# Patient Record
Sex: Female | Born: 1995 | Race: Black or African American | Hispanic: No | Marital: Single | Smoking: Never smoker
Health system: Southern US, Community
[De-identification: ages and names within clinical notes are randomized; demographics above are authoritative.]

## PROBLEM LIST (undated history)

## (undated) ENCOUNTER — Inpatient Hospital Stay (HOSPITAL_COMMUNITY): Payer: Self-pay

## (undated) DIAGNOSIS — N83209 Unspecified ovarian cyst, unspecified side: Secondary | ICD-10-CM

## (undated) HISTORY — PX: OVARIAN CYST SURGERY: SHX726

---

## 2015-07-24 ENCOUNTER — Emergency Department: Payer: Commercial Managed Care - HMO

## 2015-07-24 ENCOUNTER — Encounter: Payer: Self-pay | Admitting: Emergency Medicine

## 2015-07-24 ENCOUNTER — Emergency Department
Admission: EM | Admit: 2015-07-24 | Discharge: 2015-07-24 | Disposition: A | Discharge status: Home / Self Care | Payer: Commercial Managed Care - HMO | Attending: Emergency Medicine | Admitting: Emergency Medicine

## 2015-07-24 DIAGNOSIS — R102 Pelvic and perineal pain: Secondary | ICD-10-CM

## 2015-07-24 DIAGNOSIS — Z3202 Encounter for pregnancy test, result negative: Secondary | ICD-10-CM | POA: Insufficient documentation

## 2015-07-24 DIAGNOSIS — N83209 Unspecified ovarian cyst, unspecified side: Secondary | ICD-10-CM

## 2015-07-24 DIAGNOSIS — N8320 Unspecified ovarian cysts: Secondary | ICD-10-CM | POA: Diagnosis not present

## 2015-07-24 HISTORY — DX: Unspecified ovarian cyst, unspecified side: N83.209

## 2015-07-24 LAB — URINALYSIS COMPLETE WITH MICROSCOPIC (ARMC ONLY)
BILIRUBIN URINE: NEGATIVE
GLUCOSE, UA: NEGATIVE mg/dL
HGB URINE DIPSTICK: NEGATIVE
Ketones, ur: NEGATIVE mg/dL
LEUKOCYTES UA: NEGATIVE
NITRITE: NEGATIVE
PH: 6 (ref 5.0–8.0)
PROTEIN: NEGATIVE mg/dL
RBC / HPF: NONE SEEN RBC/hpf (ref 0–5)
SPECIFIC GRAVITY, URINE: 1.018 (ref 1.005–1.030)

## 2015-07-24 LAB — CBC WITH DIFFERENTIAL/PLATELET
Basophils Absolute: 0 10*3/uL (ref 0–0.1)
Basophils Relative: 1 %
Eosinophils Absolute: 0.4 10*3/uL (ref 0–0.7)
Eosinophils Relative: 6 %
HCT: 39.4 % (ref 35.0–47.0)
Hemoglobin: 13 g/dL (ref 12.0–16.0)
Lymphocytes Relative: 44 %
Lymphs Abs: 2.5 10*3/uL (ref 1.0–3.6)
MCH: 31.1 pg (ref 26.0–34.0)
MCHC: 33 g/dL (ref 32.0–36.0)
MCV: 94.1 fL (ref 80.0–100.0)
MONO ABS: 0.6 10*3/uL (ref 0.2–0.9)
MONOS PCT: 11 %
NEUTROS PCT: 38 %
Neutro Abs: 2.1 10*3/uL (ref 1.4–6.5)
PLATELETS: 190 10*3/uL (ref 150–440)
RBC: 4.19 MIL/uL (ref 3.80–5.20)
RDW: 12.7 % (ref 11.5–14.5)
WBC: 5.6 10*3/uL (ref 3.6–11.0)

## 2015-07-24 LAB — COMPREHENSIVE METABOLIC PANEL
ALBUMIN: 4 g/dL (ref 3.5–5.0)
ALT: 10 U/L — ABNORMAL LOW (ref 14–54)
AST: 17 U/L (ref 15–41)
Alkaline Phosphatase: 61 U/L (ref 38–126)
Anion gap: 5 (ref 5–15)
BILIRUBIN TOTAL: 0.2 mg/dL — AB (ref 0.3–1.2)
BUN: 14 mg/dL (ref 6–20)
CO2: 27 mmol/L (ref 22–32)
CREATININE: 0.79 mg/dL (ref 0.44–1.00)
Calcium: 9.1 mg/dL (ref 8.9–10.3)
Chloride: 106 mmol/L (ref 101–111)
GFR calc Af Amer: >60 mL/min (ref >60)
GFR calc non Af Amer: >60 mL/min (ref >60)
Glucose, Bld: 91 mg/dL (ref 65–99)
POTASSIUM: 3.5 mmol/L (ref 3.5–5.1)
Sodium: 138 mmol/L (ref 135–145)
Total Protein: 7.5 g/dL (ref 6.5–8.1)

## 2015-07-24 LAB — POCT PREGNANCY, URINE: Preg Test, Ur: NEGATIVE

## 2015-07-24 MED ORDER — MORPHINE SULFATE 2 MG/ML IJ SOLN
2.0000 mg | Freq: Once | INTRAMUSCULAR | Status: AC
  Administered 2015-07-24: 2 mg via INTRAVENOUS
  Filled 2015-07-24: qty 1

## 2015-07-24 MED ORDER — OXYCODONE-ACETAMINOPHEN 5-325 MG PO TABS
1.0000 | ORAL_TABLET | Freq: Four times a day (QID) | ORAL | Status: DC | PRN
Start: 2015-07-24 — End: 2015-08-27

## 2015-07-24 MED ORDER — NAPROXEN 250 MG PO TABS: 250.0000 mg | ORAL_TABLET | Freq: Two times a day (BID) | ORAL | Status: DC

## 2015-07-24 MED ORDER — ONDANSETRON HCL 4 MG/2ML IJ SOLN
4.0000 mg | Freq: Once | INTRAMUSCULAR | Status: AC
  Administered 2015-07-24: 4 mg via INTRAVENOUS
  Filled 2015-07-24: qty 2

## 2015-07-24 NOTE — ED Notes (Signed)
Patient transported to Ultrasound 

## 2015-07-24 NOTE — ED Notes (Signed)
Pt discharged home after verbalizing understanding of discharge instructions; nad noted. 

## 2015-07-24 NOTE — ED Provider Notes (Signed)
[facility] Emergency Department Provider Note  ____________________________________________  Time seen: 6:10 AM  I have reviewed the triage vital signs and the nursing notes.   HISTORY  Chief Complaint Abdominal Pain; Pelvic Pain; and Nausea      HPI Maria Sullivan is a 19 y.o. female resents with acute onset of 10 out of 10 pelvic pain 2 hours. Patient states that she was recently diagnosed with a ovarian cyst approximately 2 weeks ago.      Past Medical History  Diagnosis Date  . Ovarian cyst     There are no active problems to display for this patient.   Past Surgical History  Procedure Laterality Date  . Ovarian cyst surgery      No current outpatient prescriptions on file.  Allergies Review of patient's allergies indicates no known allergies.  No family history on file.  Social History History  Substance Use Topics  . Smoking status: Never Smoker   . Smokeless tobacco: Never Used  . Alcohol Use: No    Review of Systems  Constitutional: Negative for fever. Eyes: Negative for visual changes. ENT: Negative for sore throat. Cardiovascular: Negative for chest pain. Respiratory: Negative for shortness of breath. Gastrointestinal: Negative for abdominal pain, vomiting and diarrhea. Genitourinary: Negative for dysuria. Positive for pelvic pain Musculoskeletal: Negative for back pain. Skin: Negative for rash. Neurological: Negative for headaches, focal weakness or numbness.   10-point ROS otherwise negative.  ____________________________________________   PHYSICAL EXAM:  VITAL SIGNS: ED Triage Vitals  Enc Vitals Group     BP 07/24/15 0550 117/74 mmHg     Pulse Rate 07/24/15 0550 106     Resp 07/24/15 0550 18     Temp 07/24/15 0550 97.9 F (36.6 C)     Temp Source 07/24/15 0550 Oral     SpO2 07/24/15 0550 99 %     Weight 07/24/15 0550 160 lb (72.576 kg)     Height 07/24/15 0550 6' (1.829 m)     Head Cir --     Peak Flow --      Pain Score 07/24/15 0554 7     Pain Loc --      Pain Edu? --      Excl. in GC? --      Constitutional: Alert and oriented. Well appearing and in no distress. Eyes: Conjunctivae are normal. PERRL. Normal extraocular movements. ENT   Head: Normocephalic and atraumatic.   Nose: No congestion/rhinnorhea.   Mouth/Throat: Mucous membranes are moist.   Neck: No stridor. Cardiovascular: Normal rate, regular rhythm. Normal and symmetric distal pulses are present in all extremities. No murmurs, rubs, or gallops. Respiratory: Normal respiratory effort without tachypnea nor retractions. Breath sounds are clear and equal bilaterally. No wheezes/rales/rhonchi. Gastrointestinal: Soft and nontender. No distention. There is no CVA tenderness.Bilateral pelvic pain with palpation Genitourinary: deferred Musculoskeletal: Nontender with normal range of motion in all extremities. No joint effusions.  No lower extremity tenderness nor edema. Neurologic:  Normal speech and language. No gross focal neurologic deficits are appreciated. Speech is normal.  Skin:  Skin is warm, dry and intact. No rash noted. Psychiatric: Mood and affect are normal. Speech and behavior are normal. Patient exhibits appropriate insight and judgment.  ____________________________________________    LABS (pertinent positives/negatives)  Labs Reviewed  COMPREHENSIVE METABOLIC PANEL - Abnormal; Notable for the following:    ALT 10 (*)    Total Bilirubin 0.2 (*)    All other components within normal limits  URINALYSIS COMPLETEWITH MICROSCOPIC (  [facility] ONLY) - Abnormal; Notable for the following:    Color, Urine YELLOW (*)    APPearance CLEAR (*)    Bacteria, UA RARE (*)    Squamous Epithelial / LPF 0-5 (*)    All other components within normal limits  CBC WITH DIFFERENTIAL/PLATELET  POCT PREGNANCY, URINE    _______  RADIOLOGY IMPRESSION: 1. Likely hemorrhagic 2.6 cm cyst within the right  ovary. 2. No evidence of ovarian torsion. 3. Normal-appearing left ovary containing numerous follicular cyst. 4. Normal-appearing uterus and endometrium. Followup ultrasound in 6-12 weeks is recommended to insure involution/resolution of the presumed hemorrhagic right ovarian cyst. If there is not involution on the follow-up examination, further evaluation with MRI might be indicated at that time.  This recommendation follows the consensus statement: Management of Asymptomatic Ovarian and Other Adnexal Cysts Imaged at [place]: Society of Radiologists in Ultrasound Consensus Conference Statement. Radiology 2010; [phone].   Electronically Signed By: Hulan Saas M.D. On: 07/24/2015 10:27     INITIAL IMPRESSION / ASSESSMENT AND PLAN / ED COURSE  Pertinent labs & imaging results that were available during my care of the patient were reviewed by me and considered in my medical decision making (see chart for details).    ____________________________________________   FINAL CLINICAL IMPRESSION(S) / ED DIAGNOSES  Final diagnoses:  Pelvic pain in female  Hemorrhagic ovarian cyst      Darci Current, MD 07/25/15 [phone]

## 2015-07-24 NOTE — ED Notes (Signed)
Pt resting in bed with eyes closed, boyfriend at bedside

## 2015-07-24 NOTE — Discharge Instructions (Signed)
Your ultrasound shows a hemorrhagic ovarian cyst on the right ovary. This should resolve on its own although you may have ongoing pain for the next week. Take naproxen twice a day and take Percocet as needed for additional pain, and follow-up with gynecology in 1-2 weeks.  You were prescribed a medication that is potentially sedating. Do not drink alcohol, drive or participate in any other potentially dangerous activities while taking this medication as it may make you sleepy. Do not take this medication with any other sedating medications, either prescription or over-the-counter. If you were prescribed Percocet or Vicodin, do not take these with acetaminophen (Tylenol) as it is already contained within these medications.   Opioid pain medications (or "narcotics") can be habit forming.  Use it as little as possible to achieve adequate pain control.  Do not use or use it with extreme caution if you have a history of opiate abuse or dependence.  If you are on a pain contract with your primary care doctor or a pain specialist, be sure to let them know you were prescribed this medication today from the [facility] Emergency Department.  This medication is intended for your use only - do not give any to anyone else and keep it in a secure place where nobody else, especially children and pets, have access to it.  It will also cause or worsen constipation, so you may want to consider taking an over-the-counter stool softener while you are taking this medication.   Abdominal Pain, Women Abdominal (stomach, pelvic, or belly) pain can be caused by many things. It is important to tell your doctor:  The location of the pain.  Does it come and go or is it present all the time?  Are there things that start the pain (eating certain foods, exercise)?  Are there other symptoms associated with the pain (fever, nausea, vomiting, diarrhea)? All of this is helpful to know when trying to find the cause of the  pain. CAUSES   Stomach: virus or bacteria infection, or ulcer.  Intestine: appendicitis (inflamed appendix), regional ileitis (Crohn's disease), ulcerative colitis (inflamed colon), irritable bowel syndrome, diverticulitis (inflamed diverticulum of the colon), or cancer of the stomach or intestine.  Gallbladder disease or stones in the gallbladder.  Kidney disease, kidney stones, or infection.  Pancreas infection or cancer.  Fibromyalgia (pain disorder).  Diseases of the female organs:  Uterus: fibroid (non-cancerous) tumors or infection.  Fallopian tubes: infection or tubal pregnancy.  Ovary: cysts or tumors.  Pelvic adhesions (scar tissue).  Endometriosis (uterus lining tissue growing in the pelvis and on the pelvic organs).  Pelvic congestion syndrome (female organs filling up with blood just before the menstrual period).  Pain with the menstrual period.  Pain with ovulation (producing an egg).  Pain with an IUD (intrauterine device, birth control) in the uterus.  Cancer of the female organs.  Functional pain (pain not caused by a disease, may improve without treatment).  Psychological pain.  Depression. DIAGNOSIS  Your doctor will decide the seriousness of your pain by doing an examination.  Blood tests.  X-rays.  Ultrasound.  CT scan (computed tomography, special type of X-ray).  MRI (magnetic resonance imaging).  Cultures, for infection.  Barium enema (dye inserted in the large intestine, to better view it with X-rays).  Colonoscopy (looking in intestine with a lighted tube).  Laparoscopy (minor surgery, looking in abdomen with a lighted tube).  Major abdominal exploratory surgery (looking in abdomen with a large incision). TREATMENT  The  treatment will depend on the cause of the pain.   Many cases can be observed and treated at home.  Over-the-counter medicines recommended by your caregiver.  Prescription medicine.  Antibiotics, for  infection.  Birth control pills, for painful periods or for ovulation pain.  Hormone treatment, for endometriosis.  Nerve blocking injections.  Physical therapy.  Antidepressants.  Counseling with a psychologist or psychiatrist.  Minor or major surgery. HOME CARE INSTRUCTIONS   Do not take laxatives, unless directed by your caregiver.  Take over-the-counter pain medicine only if ordered by your caregiver. Do not take aspirin because it can cause an upset stomach or bleeding.  Try a clear liquid diet (broth or water) as ordered by your caregiver. Slowly move to a bland diet, as tolerated, if the pain is related to the stomach or intestine.  Have a thermometer and take your temperature several times a day, and record it.  Bed rest and sleep, if it helps the pain.  Avoid sexual intercourse, if it causes pain.  Avoid stressful situations.  Keep your follow-up appointments and tests, as your caregiver orders.  If the pain does not go away with medicine or surgery, you may try:  Acupuncture.  Relaxation exercises (yoga, meditation).  Group therapy.  Counseling. SEEK MEDICAL CARE IF:   You notice certain foods cause stomach pain.  Your home care treatment is not helping your pain.  You need stronger pain medicine.  You want your IUD removed.  You feel faint or lightheaded.  You develop nausea and vomiting.  You develop a rash.  You are having side effects or an allergy to your medicine. SEEK IMMEDIATE MEDICAL CARE IF:   Your pain does not go away or gets worse.  You have a fever.  Your pain is felt only in portions of the abdomen. The right side could possibly be appendicitis. The left lower portion of the abdomen could be colitis or diverticulitis.  You are passing blood in your stools (bright red or black tarry stools, with or without vomiting).  You have blood in your urine.  You develop chills, with or without a fever.  You pass out. MAKE SURE  YOU:   Understand these instructions.  Will watch your condition.  Will get help right away if you are not doing well or get worse. Document Released: 09/28/2007 Document Revised: 04/17/2014 Document Reviewed: 10/18/2009 [facility] Patient Information 2015 [place], [place]. This information is not intended to replace advice given to you by your health care provider. Make sure you discuss any questions you have with your health care provider.  Ovarian Cyst An ovarian cyst is a fluid-filled sac that forms on an ovary. The ovaries are small organs that produce eggs in women. Various types of cysts can form on the ovaries. Most are not cancerous. Many do not cause problems, and they often go away on their own. Some may cause symptoms and require treatment. Common types of ovarian cysts include:  Functional cysts--These cysts may occur every month during the menstrual cycle. This is normal. The cysts usually go away with the next menstrual cycle if the woman does not get pregnant. Usually, there are no symptoms with a functional cyst.  Endometrioma cysts--These cysts form from the tissue that lines the uterus. They are also called "chocolate cysts" because they become filled with blood that turns brown. This type of cyst can cause pain in the lower abdomen during intercourse and with your menstrual period.  Cystadenoma cysts--This type develops from the  cells on the outside of the ovary. These cysts can get very big and cause lower abdomen pain and pain with intercourse. This type of cyst can twist on itself, cut off its blood supply, and cause severe pain. It can also easily rupture and cause a lot of pain.  Dermoid cysts--This type of cyst is sometimes found in both ovaries. These cysts may contain different kinds of body tissue, such as skin, teeth, hair, or cartilage. They usually do not cause symptoms unless they get very big.  Theca lutein cysts--These cysts occur when too much of a certain  hormone (human chorionic gonadotropin) is produced and overstimulates the ovaries to produce an egg. This is most common after procedures used to assist with the conception of a baby (in vitro fertilization). CAUSES   Fertility drugs can cause a condition in which multiple large cysts are formed on the ovaries. This is called ovarian hyperstimulation syndrome.  A condition called polycystic ovary syndrome can cause hormonal imbalances that can lead to nonfunctional ovarian cysts. SIGNS AND SYMPTOMS  Many ovarian cysts do not cause symptoms. If symptoms are present, they may include:  Pelvic pain or pressure.  Pain in the lower abdomen.  Pain during sexual intercourse.  Increasing girth (swelling) of the abdomen.  Abnormal menstrual periods.  Increasing pain with menstrual periods.  Stopping having menstrual periods without being pregnant. DIAGNOSIS  These cysts are commonly found during a routine or annual pelvic exam. Tests may be ordered to find out more about the cyst. These tests may include:  Ultrasound.  X-ray of the pelvis.  CT scan.  MRI.  Blood tests. TREATMENT  Many ovarian cysts go away on their own without treatment. Your health care provider may want to check your cyst regularly for 2-3 months to see if it changes. For women in menopause, it is particularly important to monitor a cyst closely because of the higher rate of ovarian cancer in menopausal women. When treatment is needed, it may include any of the following:  A procedure to drain the cyst (aspiration). This may be done using a long needle and ultrasound. It can also be done through a laparoscopic procedure. This involves using a thin, lighted tube with a tiny camera on the end (laparoscope) inserted through a small incision.  Surgery to remove the whole cyst. This may be done using laparoscopic surgery or an open surgery involving a larger incision in the lower abdomen.  Hormone treatment or birth  control pills. These methods are sometimes used to help dissolve a cyst. HOME CARE INSTRUCTIONS   Only take over-the-counter or prescription medicines as directed by your health care provider.  Follow up with your health care provider as directed.  Get regular pelvic exams and Pap tests. SEEK MEDICAL CARE IF:   Your periods are late, irregular, or painful, or they stop.  Your pelvic pain or abdominal pain does not go away.  Your abdomen becomes larger or swollen.  You have pressure on your bladder or trouble emptying your bladder completely.  You have pain during sexual intercourse.  You have feelings of fullness, pressure, or discomfort in your stomach.  You lose weight for no apparent reason.  You feel generally ill.  You become constipated.  You lose your appetite.  You develop acne.  You have an increase in body and facial hair.  You are gaining weight, without changing your exercise and eating habits.  You think you are pregnant. SEEK IMMEDIATE MEDICAL CARE IF:  You have increasing abdominal pain.  You feel sick to your stomach (nauseous), and you throw up (vomit).  You develop a fever that comes on suddenly.  You have abdominal pain during a bowel movement.  Your menstrual periods become heavier than usual. MAKE SURE YOU:  Understand these instructions.  Will watch your condition.  Will get help right away if you are not doing well or get worse. Document Released: 12/01/2005 Document Revised: 12/06/2013 Document Reviewed: 08/08/2013 [facility] Patient Information 2015 [place], [place]. This information is not intended to replace advice given to you by your health care provider. Make sure you discuss any questions you have with your health care provider.

## 2015-07-24 NOTE — ED Provider Notes (Signed)
Vitals remained stable. Pain is controlled. Workup reveals a hemorrhagic right ovarian cyst which is the very likely cause of her symptoms. Low suspicion for appendicitis or surgical pathology. No evidence of torsion. We'll discharge home with naproxen and Percocet and referral information to follow up with gynecology.  Sharman Cheek, MD 07/24/15 [phone]

## 2015-07-24 NOTE — ED Notes (Signed)
Pt presents to ED with lower abd/pelvic pain with severe nausea. Onset approx 2 hours ago. Pt states 2 weeks ago she had similar symptoms and was dx with an ovarian cyst. reports symptoms improving slightly with light pressure on her abd. Pt alert; slightly anxious. Also c/o headache with no hx of the same.

## 2015-07-24 NOTE — ED Notes (Signed)
Pt returned from US

## 2015-08-07 DIAGNOSIS — R1110 Vomiting, unspecified: Secondary | ICD-10-CM | POA: Diagnosis not present

## 2015-08-08 ENCOUNTER — Emergency Department
Admission: EM | Admit: 2015-08-08 | Discharge: 2015-08-08 | Discharge status: Against Medical Advice | Payer: Commercial Managed Care - HMO | Attending: Emergency Medicine | Admitting: Emergency Medicine

## 2015-08-08 ENCOUNTER — Encounter: Payer: Self-pay | Admitting: Urgent Care

## 2015-08-08 NOTE — ED Notes (Signed)
Patient presents ot ED with c/o ONE episode of vomiting after eating chicken.

## 2015-08-23 ENCOUNTER — Encounter: Payer: Self-pay | Admitting: Emergency Medicine

## 2015-08-23 ENCOUNTER — Emergency Department
Admission: EM | Admit: 2015-08-23 | Discharge: 2015-08-23 | Discharge status: Against Medical Advice | Payer: Commercial Managed Care - HMO | Attending: Emergency Medicine | Admitting: Emergency Medicine

## 2015-08-23 DIAGNOSIS — R51 Headache: Secondary | ICD-10-CM | POA: Insufficient documentation

## 2015-08-23 DIAGNOSIS — R110 Nausea: Secondary | ICD-10-CM | POA: Insufficient documentation

## 2015-08-23 DIAGNOSIS — R109 Unspecified abdominal pain: Secondary | ICD-10-CM | POA: Diagnosis not present

## 2015-08-23 LAB — COMPREHENSIVE METABOLIC PANEL
ALT: 10 U/L — ABNORMAL LOW (ref 14–54)
ANION GAP: 5 (ref 5–15)
AST: 16 U/L (ref 15–41)
Albumin: 3.8 g/dL (ref 3.5–5.0)
Alkaline Phosphatase: 70 U/L (ref 38–126)
BUN: 8 mg/dL (ref 6–20)
CHLORIDE: 106 mmol/L (ref 101–111)
CO2: 26 mmol/L (ref 22–32)
Calcium: 9.1 mg/dL (ref 8.9–10.3)
Creatinine, Ser: 0.74 mg/dL (ref 0.44–1.00)
Glucose, Bld: 100 mg/dL — ABNORMAL HIGH (ref 65–99)
POTASSIUM: 3.9 mmol/L (ref 3.5–5.1)
Sodium: 137 mmol/L (ref 135–145)
Total Bilirubin: 0.3 mg/dL (ref 0.3–1.2)
Total Protein: 7.4 g/dL (ref 6.5–8.1)

## 2015-08-23 LAB — URINALYSIS COMPLETE WITH MICROSCOPIC (ARMC ONLY)
BILIRUBIN URINE: NEGATIVE
GLUCOSE, UA: NEGATIVE mg/dL
HGB URINE DIPSTICK: NEGATIVE
Ketones, ur: NEGATIVE mg/dL
Nitrite: NEGATIVE
Protein, ur: NEGATIVE mg/dL
Specific Gravity, Urine: 1.003 — ABNORMAL LOW (ref 1.005–1.030)
pH: 6 (ref 5.0–8.0)

## 2015-08-23 LAB — LIPASE, BLOOD: LIPASE: 26 U/L (ref 22–51)

## 2015-08-23 LAB — CBC
HEMATOCRIT: 38.1 % (ref 35.0–47.0)
Hemoglobin: 12.8 g/dL (ref 12.0–16.0)
MCH: 31.2 pg (ref 26.0–34.0)
MCHC: 33.6 g/dL (ref 32.0–36.0)
MCV: 93 fL (ref 80.0–100.0)
Platelets: 213 10*3/uL (ref 150–440)
RBC: 4.1 MIL/uL (ref 3.80–5.20)
RDW: 12.8 % (ref 11.5–14.5)
WBC: 5.2 10*3/uL (ref 3.6–11.0)

## 2015-08-23 LAB — HCG, QUANTITATIVE, PREGNANCY: HCG, BETA CHAIN, QUANT, S: 152 m[IU]/mL — AB (ref <5)

## 2015-08-23 NOTE — ED Notes (Signed)
Patient to ED with report of abdominal cramping, nausea and headache over the last few weeks, has taken 3 tests all with negative results and this morning took one which was faintly positive. Urine pregnancy in triage negative.

## 2015-08-23 NOTE — ED Notes (Signed)
Urine preg (poc) negative.

## 2015-08-23 NOTE — ED Notes (Signed)
Patient's boyfriend up to desk to inquire about wait time. Informed that we were unable to give times but that she should be going back soon. Boyfriend stated that they did not want to see a doctor they were just here for a pregnancy test. Informed friend/patient that we did not only do pregnancy tests in ED.

## 2015-08-24 ENCOUNTER — Inpatient Hospital Stay (HOSPITAL_COMMUNITY)
Admission: AD | Admit: 2015-08-24 | Discharge: 2015-08-24 | Disposition: A | Discharge status: Home / Self Care | Payer: Commercial Managed Care - HMO | Source: Ambulatory Visit | Attending: Obstetrics & Gynecology | Admitting: Obstetrics & Gynecology

## 2015-08-24 ENCOUNTER — Encounter (HOSPITAL_COMMUNITY): Payer: Self-pay | Admitting: *Deleted

## 2015-08-24 ENCOUNTER — Inpatient Hospital Stay (HOSPITAL_COMMUNITY): Payer: Commercial Managed Care - HMO

## 2015-08-24 DIAGNOSIS — R109 Unspecified abdominal pain: Secondary | ICD-10-CM | POA: Insufficient documentation

## 2015-08-24 DIAGNOSIS — O9989 Other specified diseases and conditions complicating pregnancy, childbirth and the puerperium: Secondary | ICD-10-CM

## 2015-08-24 DIAGNOSIS — Z3A01 Less than 8 weeks gestation of pregnancy: Secondary | ICD-10-CM | POA: Insufficient documentation

## 2015-08-24 DIAGNOSIS — O26891 Other specified pregnancy related conditions, first trimester: Secondary | ICD-10-CM | POA: Diagnosis not present

## 2015-08-24 DIAGNOSIS — O26899 Other specified pregnancy related conditions, unspecified trimester: Secondary | ICD-10-CM

## 2015-08-24 NOTE — MAU Provider Note (Signed)
History     CSN: [id number]  Arrival date and time: 08/24/15 7829   First Provider Initiated Contact with Patient 08/24/15 0457      No chief complaint on file.  HPI [name] is a 19 y.o. G2P0010 at [redacted]w[redacted]d who presents to MAU today with complaint of abdominal pain x 1 week. The patient rates abdominal pain at 6/10 now. She states that it is cramping in nature. She has taken Exedrin with minimal relief. She denies vaginal bleeding, discharge or UTI symptoms. She states LMP 07/16/15. She was seen earlier today at [facility] and had negative UPT. Quant hCG was drawn, but patient left prior to getting lab results because she felt it was taking too long.   OB History    Gravida Para Term Preterm AB TAB SAB Ectopic Multiple Living   2    1 1     0      Past Medical History  Diagnosis Date  . Ovarian cyst     Past Surgical History  Procedure Laterality Date  . Ovarian cyst surgery      No family history on file.  Social History  Substance Use Topics  . Smoking status: Never Smoker   . Smokeless tobacco: Never Used  . Alcohol Use: No    Allergies: No Known Allergies  Prescriptions prior to admission  Medication Sig Dispense Refill Last Dose  . ibuprofen (ADVIL,MOTRIN) 200 MG tablet Take 200 mg by mouth every 6 (six) hours as needed.   PRN at PRN  . naproxen (NAPROSYN) 250 MG tablet Take 1 tablet (250 mg total) by mouth 2 (two) times daily with a meal. 40 tablet 0   . oxyCODONE-acetaminophen (ROXICET) 5-325 MG per tablet Take 1 tablet by mouth every 6 (six) hours as needed for severe pain. 12 tablet 0     Review of Systems  Constitutional: Negative for fever and malaise/fatigue.  Gastrointestinal: Positive for nausea and abdominal pain. Negative for vomiting, diarrhea and constipation.  Genitourinary: Negative for dysuria, urgency and frequency.       Neg - vaginal bleeding, discharge   Physical Exam   Blood pressure 118/78, pulse 85, temperature 98.5 F (36.9 C),  temperature source Oral, resp. rate 20, height 5\' 10"  (1.778 m), weight 154 lb 8 oz (70.081 kg), last menstrual period 07/16/2015.  Physical Exam  Nursing note and vitals reviewed. Constitutional: She is oriented to person, place, and time. She appears well-developed and well-nourished. No distress.  HENT:  Head: Normocephalic and atraumatic.  Cardiovascular: Normal rate.   Respiratory: Effort normal.  GI: Soft. She exhibits no distension.  Neurological: She is alert and oriented to person, place, and time.  Skin: Skin is warm and dry. No erythema.  Psychiatric: She has a normal mood and affect.    Results for orders placed or performed during the hospital encounter of 08/23/15 (from the past 24 hour(s))  Lipase, blood     Status: None   Collection Time: 08/23/15  7:33 AM  Result Value Ref Range   Lipase 26 22 - 51 U/L  Comprehensive metabolic panel     Status: Abnormal   Collection Time: 08/23/15  7:33 AM  Result Value Ref Range   Sodium 137 135 - 145 mmol/L   Potassium 3.9 3.5 - 5.1 mmol/L   Chloride 106 101 - 111 mmol/L   CO2 26 22 - 32 mmol/L   Glucose, Bld 100 (H) 65 - 99 mg/dL   BUN 8 6 - 20 mg/dL  Creatinine, Ser 0.74 0.44 - 1.00 mg/dL   Calcium 9.1 8.9 - 16.1 mg/dL   Total Protein 7.4 6.5 - 8.1 g/dL   Albumin 3.8 3.5 - 5.0 g/dL   AST 16 15 - 41 U/L   ALT 10 (L) 14 - 54 U/L   Alkaline Phosphatase 70 38 - 126 U/L   Total Bilirubin 0.3 0.3 - 1.2 mg/dL   GFR calc non Af Amer >60 >60 mL/min   GFR calc Af Amer >60 >60 mL/min   Anion gap 5 5 - 15  CBC     Status: None   Collection Time: 08/23/15  7:33 AM  Result Value Ref Range   WBC 5.2 3.6 - 11.0 K/uL   RBC 4.10 3.80 - 5.20 MIL/uL   Hemoglobin 12.8 12.0 - 16.0 g/dL   HCT 09.6 04.5 - 40.9 %   MCV 93.0 80.0 - 100.0 fL   MCH 31.2 26.0 - 34.0 pg   MCHC 33.6 32.0 - 36.0 g/dL   RDW 81.1 91.4 - 78.2 %   Platelets 213 150 - 440 K/uL  Urinalysis complete, with microscopic ([facility] only)     Status: Abnormal   Collection  Time: 08/23/15  7:33 AM  Result Value Ref Range   Color, Urine STRAW (A) YELLOW   APPearance CLEAR (A) CLEAR   Glucose, UA NEGATIVE NEGATIVE mg/dL   Bilirubin Urine NEGATIVE NEGATIVE   Ketones, ur NEGATIVE NEGATIVE mg/dL   Specific Gravity, Urine 1.003 (L) 1.005 - 1.030   Hgb urine dipstick NEGATIVE NEGATIVE   pH 6.0 5.0 - 8.0   Protein, ur NEGATIVE NEGATIVE mg/dL   Nitrite NEGATIVE NEGATIVE   Leukocytes, UA TRACE (A) NEGATIVE   RBC / HPF 0-5 0 - 5 RBC/hpf   WBC, UA 0-5 0 - 5 WBC/hpf   Bacteria, UA RARE (A) NONE SEEN   Squamous Epithelial / LPF 0-5 (A) NONE SEEN  hCG, quantitative, pregnancy     Status: Abnormal   Collection Time: 08/23/15  7:33 AM  Result Value Ref Range   hCG, Beta Chain, Quant, S 152 (H) <5 mIU/mL   US Ob Comp Less 14 Wks  08/24/2015   CLINICAL DATA:  Abdominal pain. Estimated gestational age by LMP is 5 weeks 4 days. Quantitative beta HCG is 152.  EXAM: OBSTETRIC <14 WK [place] AND TRANSVAGINAL OB US  TECHNIQUE: Both transabdominal and transvaginal ultrasound examinations were performed for complete evaluation of the gestation as well as the maternal uterus, adnexal regions, and pelvic cul-de-sac. Transvaginal technique was performed to assess early pregnancy.  COMPARISON:  07/24/2015  FINDINGS: Intrauterine gestational sac: No intrauterine pregnancy demonstrated.  Yolk sac:  Not identified.  Embryo:  Not identified.  Cardiac Activity: Not identified.  Maternal uterus/adnexae: Uterus is anteverted. No myometrial mass lesions. Prominent hyperechoic endometrium is nonspecific.  Right ovary measures 3.5 x 2.6 by 2.8 cm. There is a homogeneous hyperechoic structure within the right ovary measuring 2.6 cm maximal diameter. This lesion was present on the previous study. 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  Left ovary measures 4.5 x 2.2 x 2.9 cm. Complex cyst likely hemorrhagic measuring 2.4 cm maximal diameter. Small amount of flow around the cyst on color flow Doppler  imaging. This may represent a corpus luteal cyst.  Small amount of free fluid in the pelvis.  IMPRESSION: No intrauterine pregnancy identified. No intrauterine gestational sac, yolk sac, or fetal pole identified. Differential considerations include intrauterine pregnancy too early to be sonographically visualized, missed abortion, or  ectopic pregnancy. Followup ultrasound is recommended in 10-14 days for further evaluation. 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 Complex cyst likely hemorrhagic in the left ovary probably representing a corpus luteal cyst.   Electronically Signed   By: Burman Nieves M.D.   On: 08/24/2015 06:09   US Ob Transvaginal  08/24/2015   CLINICAL DATA:  Abdominal pain. Estimated gestational age by LMP is 5 weeks 4 days. Quantitative beta HCG is 152.  EXAM: OBSTETRIC <14 WK [place] AND TRANSVAGINAL OB US  TECHNIQUE: Both transabdominal and transvaginal ultrasound examinations were performed for complete evaluation of the gestation as well as the maternal uterus, adnexal regions, and pelvic cul-de-sac. Transvaginal technique was performed to assess early pregnancy.  COMPARISON:  07/24/2015  FINDINGS: Intrauterine gestational sac: No intrauterine pregnancy demonstrated.  Yolk sac:  Not identified.  Embryo:  Not identified.  Cardiac Activity: Not identified.  Maternal uterus/adnexae: Uterus is anteverted. No myometrial mass lesions. Prominent hyperechoic endometrium is nonspecific.  Right ovary measures 3.5 x 2.6 by 2.8 cm. There is a homogeneous hyperechoic structure within the right ovary measuring 2.6 cm maximal diameter. This lesion was present on the previous study. 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  Left ovary measures 4.5 x 2.2 x 2.9 cm. Complex cyst likely hemorrhagic measuring 2.4 cm maximal diameter. Small amount of flow around the cyst on color flow Doppler imaging. This may represent a corpus luteal cyst.  Small amount of  free fluid in the pelvis.  IMPRESSION: No intrauterine pregnancy identified. No intrauterine gestational sac, yolk sac, or fetal pole identified. Differential considerations include intrauterine pregnancy too early to be sonographically visualized, missed abortion, or ectopic pregnancy. Followup ultrasound is recommended in 10-14 days for further evaluation. 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 Complex cyst likely hemorrhagic in the left ovary probably representing a corpus luteal cyst.   Electronically Signed   By: Burman Nieves M.D.   On: 08/24/2015 06:09    MAU Course  Procedures None  MDM UPT - negative at [place] CBC, CMP, Lipase and quant hCG performed at [facility] Patient left prior to results. Came here for confirmation of pregnancy.  Advised that [place] was needed due to complaint of cramping. Patient agreed. [place] ordered.   Assessment and Plan  A: Abdominal pain in pregnancy Pregnancy of unknown location  P: Discharge home Tylenol PRN for pain advised Ectopic precautions discussed Patient advised to follow-up in MAU in 48 hours for repeat labs or sooner if her condition were to change or worsen   Marny Lowenstein, PA-C  08/24/2015, 6:16 AM

## 2015-08-24 NOTE — MAU Note (Addendum)
PT SAYS  SHE TOOK UPT  YESTERDAY 0400-  FAINT POSITIVE  THEN WENT  TO [facility]-  THERE UPT  WAS NEG.       DID  LABS   THEN  SHE  LEFT  AFTER 5 HRS.      NO BIRTH CONTROL.   LAST SEX- 930PM  YESTERDAY.     HAS  CRAMPS-  TOOK EXCEDRIN   YESTERDAY-     WITH  SOME RELIEF.  PT  SAYS SHE IS  FROM  [place]   - JUST  MOVED   HERE 2  [facility]TH[facility]  [facility]-   NO DR.

## 2015-08-24 NOTE — Discharge Instructions (Signed)
First Trimester of Pregnancy The first trimester of pregnancy is from week 1 until the end of week 12 (months 1 through 3). During this time, your baby will begin to develop inside you. At 6-8 weeks, the eyes and face are formed, and the heartbeat can be seen on ultrasound. At the end of 12 weeks, all the baby's organs are formed. Prenatal care is all the medical care you receive before the birth of your baby. Make sure you get good prenatal care and follow all of your doctor's instructions. HOME CARE  Medicines  Take medicine only as told by your doctor. Some medicines are safe and some are not during pregnancy.  Take your prenatal vitamins as told by your doctor.  Take medicine that helps you poop (stool softener) as needed if your doctor says it is okay. Diet  Eat regular, healthy meals.  Your doctor will tell you the amount of weight gain that is right for you.  Avoid raw meat and uncooked cheese.  If you feel sick to your stomach (nauseous) or throw up (vomit):  Eat 4 or 5 small meals a day instead of 3 large meals.  Try eating a few soda crackers.  Drink liquids between meals instead of during meals.  If you have a hard time pooping (constipation):  Eat high-fiber foods like fresh vegetables, fruit, and whole grains.  Drink enough fluids to keep your pee (urine) clear or pale yellow. Activity and Exercise  Exercise only as told by your doctor. Stop exercising if you have cramps or pain in your lower belly (abdomen) or low back.  Try to avoid standing for long periods of time. Move your legs often if you must stand in one place for a long time.  Avoid heavy lifting.  Wear low-heeled shoes. Sit and stand up straight.  You can have sex unless your doctor tells you not to. Relief of Pain or Discomfort  Wear a good support bra if your breasts are sore.  Take warm water baths (sitz baths) to soothe pain or discomfort caused by hemorrhoids. Use hemorrhoid cream if your  doctor says it is okay.  Rest with your legs raised if you have leg cramps or low back pain.  Wear support hose if you have puffy, bulging veins (varicose veins) in your legs. Raise (elevate) your feet for 15 minutes, 3-4 times a day. Limit salt in your diet. Prenatal Care  Schedule your prenatal visits by the twelfth week of pregnancy.  Write down your questions. Take them to your prenatal visits.  Keep all your prenatal visits as told by your doctor. Safety  Wear your seat belt at all times when driving.  Make a list of emergency phone numbers. The list should include numbers for family, friends, the hospital, and police and fire departments. General Tips  Ask your doctor for a referral to a local prenatal class. Begin classes no later than at the start of month 6 of your pregnancy.  Ask for help if you need counseling or help with nutrition. Your doctor can give you advice or tell you where to go for help.  Do not use hot tubs, steam rooms, or saunas.  Do not douche or use tampons or scented sanitary pads.  Do not cross your legs for long periods of time.  Avoid litter boxes and soil used by cats.  Avoid all smoking, herbs, and alcohol. Avoid drugs not approved by your doctor.  Visit your dentist. At home, brush your teeth   with a soft toothbrush. Be gentle when you floss. GET HELP IF:  You are dizzy.  You have mild cramps or pressure in your lower belly.  You have a nagging pain in your belly area.  You continue to feel sick to your stomach, throw up, or have watery poop (diarrhea).  You have a bad smelling fluid coming from your vagina.  You have pain with peeing (urination).  You have increased puffiness (swelling) in your face, hands, legs, or ankles. GET HELP RIGHT AWAY IF:   You have a fever.  You are leaking fluid from your vagina.  You have spotting or bleeding from your vagina.  You have very bad belly cramping or pain.  You gain or lose weight  rapidly.  You throw up blood. It may look like coffee grounds.  You are around people who have German measles, fifth disease, or chickenpox.  You have a very bad headache.  You have shortness of breath.  You have any kind of trauma, such as from a fall or a car accident. Document Released: 05/19/2008 Document Revised: 04/17/2014 Document Reviewed: 10/11/2013 Exit[employer] Patient Information 2015 [employer]. This information is not intended to replace advice given to you by your health care provider. Make sure you discuss any questions you have with your health care provider.  

## 2015-08-26 ENCOUNTER — Inpatient Hospital Stay (HOSPITAL_COMMUNITY)
Admission: AD | Admit: 2015-08-26 | Discharge: 2015-08-27 | Disposition: A | Discharge status: Home / Self Care | Payer: Commercial Managed Care - HMO | Source: Ambulatory Visit | Attending: Obstetrics & Gynecology | Admitting: Obstetrics & Gynecology

## 2015-08-26 ENCOUNTER — Encounter (HOSPITAL_COMMUNITY): Payer: Self-pay

## 2015-08-26 DIAGNOSIS — Z3A01 Less than 8 weeks gestation of pregnancy: Secondary | ICD-10-CM | POA: Insufficient documentation

## 2015-08-26 DIAGNOSIS — O3481 Maternal care for other abnormalities of pelvic organs, first trimester: Secondary | ICD-10-CM | POA: Insufficient documentation

## 2015-08-26 DIAGNOSIS — R102 Pelvic and perineal pain: Secondary | ICD-10-CM | POA: Insufficient documentation

## 2015-08-26 DIAGNOSIS — N8320 Unspecified ovarian cysts: Secondary | ICD-10-CM | POA: Diagnosis not present

## 2015-08-26 DIAGNOSIS — O9989 Other specified diseases and conditions complicating pregnancy, childbirth and the puerperium: Secondary | ICD-10-CM | POA: Diagnosis not present

## 2015-08-26 DIAGNOSIS — O3680X0 Pregnancy with inconclusive fetal viability, not applicable or unspecified: Secondary | ICD-10-CM

## 2015-08-26 DIAGNOSIS — R109 Unspecified abdominal pain: Secondary | ICD-10-CM | POA: Diagnosis present

## 2015-08-26 DIAGNOSIS — O26899 Other specified pregnancy related conditions, unspecified trimester: Secondary | ICD-10-CM

## 2015-08-26 NOTE — MAU Provider Note (Signed)
History   [id number]   Chief Complaint  Patient presents with  . Labs Only    HPI Maria Sullivan is a 19 y.o. female G2P0010 here for follow-up BHCG.  Upon review of the records patient was first seen on 08/24/15 for abdominal pain x 1 week.   BHCG on that day was 152.  Ultrasound showed No intrauterine pregnancy identified. No intrauterine gestational sac, yolk sac, or fetal pole identified. Differential considerations include intrauterine pregnancy too early to be sonographically visualized, missed abortion, or ectopic pregnancy. Followup ultrasound is recommended in 10-14 days for further evaluation. 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 Complex cyst likely hemorrhagic in the left ovary probably representing a corpus luteal cyst.   Pt here today with report of increased left sided pelvic pain that started last night and is progressively getting worse.  All other systems negative.  +nausea, no report of vomiting.  +headaches.  Patient's last menstrual period was 07/16/2015.  Review of Systems  Gastrointestinal: Positive for nausea. Negative for vomiting.  Genitourinary: Positive for vaginal bleeding and pelvic pain.  Neurological: Positive for headaches.  All other systems reviewed and are negative.   OB History  Gravida Para Term Preterm AB SAB TAB Ectopic Multiple Living  2    1  1    0    # Outcome Date GA Lbr Len/2nd Weight Sex Delivery Anes PTL Lv  2 Current           1 TAB               Past Medical History  Diagnosis Date  . Ovarian cyst     History reviewed. No pertinent family history.  Social History   Social History  . Marital Status: Single    Spouse Name: N/A  . Number of Children: N/A  . Years of Education: N/A   Social History Main Topics  . Smoking status: Never Smoker   . Smokeless tobacco: Never Used  . Alcohol Use: No  . Drug Use: No  . Sexual Activity: Not Asked   Other Topics Concern  .  None   Social History Narrative    No Known Allergies  No current facility-administered medications on file prior to encounter.   Current Outpatient Prescriptions on File Prior to Encounter  Medication Sig Dispense Refill  . oxyCODONE-acetaminophen (ROXICET) 5-325 MG per tablet Take 1 tablet by mouth every 6 (six) hours as needed for severe pain. 12 tablet 0     Filed Vitals:   08/26/15 2259  BP: 115/66  Pulse: 89  Temp: 98.5 F (36.9 C)  TempSrc: Oral  Resp: 18  Height: 5' 11.2" (1.808 m)  Weight: 70.988 kg (156 lb 8 oz)  SpO2: 100%    Physical Exam  Constitutional: She is oriented to person, place, and time. She appears well-developed and well-nourished. No distress.  HENT:  Head: Normocephalic.  Neck: Normal range of motion. Neck supple.  Cardiovascular: Normal rate, regular rhythm and normal heart sounds.   Respiratory: Effort normal and breath sounds normal. No respiratory distress.  GI: Soft. She exhibits no mass. There is no tenderness. There is no rebound and no guarding.  Genitourinary: Right adnexum displays no mass, no tenderness and no fullness. Left adnexum displays tenderness. Left adnexum displays no mass. No bleeding in the vagina.  Musculoskeletal: Normal range of motion.  Neurological: She is alert and oriented to person, place, and time.  Skin: Skin is warm and dry.  MAU Course  Procedures  MDM Beta HCG ordered  Ultrasound ordered  Results for orders placed or performed during the hospital encounter of 08/26/15 (from the past 24 hour(s))  hCG, quantitative, pregnancy     Status: Abnormal   Collection Time: 08/26/15 11:25 PM  Result Value Ref Range   hCG, Beta Chain, Quant, S 692 (H) <5 mIU/mL   Ultrasound: FINDINGS: Intrauterine gestational sac: Tiny gestational sac now visualized in the endometrium which is thickened.  Yolk sac: Not present.  Embryo: Not present.  Cardiac Activity: Not present.  MSD: 2.8 mm 5 w 0  d  Maternal uterus/adnexae: No findings of subchorionic arm injury on the small gestational sac. Right ovary measures 4.6 x 2.9 x 3.1 cm and contains a homogeneously echogenic 3.2 x 2.3 x 1.9 cm structure that is unchanged in size. The left ovary measures 4.5 x 2.2 x 3.3 cm and contains a probable hemorrhagic 2.5 x 2.2 x 2.1 cm corpus luteal cyst. Blood flow seen to both ovarian parenchyma. Small amount of free fluid in the pelvis.  IMPRESSION: 1. Development of small intrauterine gestational sac, mean sac diameter of 2.8 mm. Findings suggest very early intrauterine pregnancy, however age yolk sac and fetal pole are not yet seen. Recommend continued trending of beta HCG and sonographic follow-up is indicated. 2. Homogeneously hyperechoic structure in the right ovary is unchanged, likely a dermoid cyst. Similarly, probable hemorrhagic cyst in the left ovary is also again seen. Assessment and Plan  19 y.o. G2P0010 at [redacted]w[redacted]d wks Pregnancy Ovarian Cyst Follow-up BHCG Pregnancy of Unknown Location  Plan: Discharge to home Follow-up ultrasound in 7 days Ectopic precautions reviewed  Marlis Edelson, CNM 08/27/2015 1:57 AM

## 2015-08-26 NOTE — MAU Note (Signed)
Pt here for follow up HCG. States that she is having more pelvic pain than usual. More on the left side but it does hurt on both sides. Denies bleeding.

## 2015-08-27 ENCOUNTER — Inpatient Hospital Stay (HOSPITAL_COMMUNITY): Payer: Commercial Managed Care - HMO

## 2015-08-27 DIAGNOSIS — O9989 Other specified diseases and conditions complicating pregnancy, childbirth and the puerperium: Secondary | ICD-10-CM

## 2015-08-27 DIAGNOSIS — R102 Pelvic and perineal pain: Secondary | ICD-10-CM | POA: Diagnosis not present

## 2015-08-27 LAB — HCG, QUANTITATIVE, PREGNANCY: HCG, BETA CHAIN, QUANT, S: 692 m[IU]/mL — AB (ref <5)

## 2015-08-27 NOTE — Discharge Instructions (Signed)

## 2015-09-03 ENCOUNTER — Ambulatory Visit (HOSPITAL_COMMUNITY): Payer: Commercial Managed Care - HMO

## 2015-09-09 ENCOUNTER — Emergency Department: Payer: Commercial Managed Care - HMO

## 2015-09-09 ENCOUNTER — Emergency Department
Admission: EM | Admit: 2015-09-09 | Discharge: 2015-09-10 | Disposition: A | Discharge status: Home / Self Care | Payer: Commercial Managed Care - HMO | Attending: Emergency Medicine | Admitting: Emergency Medicine

## 2015-09-09 DIAGNOSIS — Z79899 Other long term (current) drug therapy: Secondary | ICD-10-CM | POA: Insufficient documentation

## 2015-09-09 DIAGNOSIS — J0110 Acute frontal sinusitis, unspecified: Secondary | ICD-10-CM | POA: Diagnosis not present

## 2015-09-09 DIAGNOSIS — Z331 Pregnant state, incidental: Secondary | ICD-10-CM | POA: Diagnosis not present

## 2015-09-09 DIAGNOSIS — R0602 Shortness of breath: Secondary | ICD-10-CM | POA: Diagnosis present

## 2015-09-09 DIAGNOSIS — J069 Acute upper respiratory infection, unspecified: Secondary | ICD-10-CM | POA: Diagnosis not present

## 2015-09-09 DIAGNOSIS — R55 Syncope and collapse: Secondary | ICD-10-CM | POA: Diagnosis not present

## 2015-09-09 LAB — CBC
HCT: 37.4 % (ref 35.0–47.0)
Hemoglobin: 12.7 g/dL (ref 12.0–16.0)
MCH: 31.5 pg (ref 26.0–34.0)
MCHC: 33.9 g/dL (ref 32.0–36.0)
MCV: 93 fL (ref 80.0–100.0)
PLATELETS: 203 10*3/uL (ref 150–440)
RBC: 4.02 MIL/uL (ref 3.80–5.20)
RDW: 12.8 % (ref 11.5–14.5)
WBC: 5.7 10*3/uL (ref 3.6–11.0)

## 2015-09-09 LAB — BASIC METABOLIC PANEL
Anion gap: 5 (ref 5–15)
BUN: 10 mg/dL (ref 6–20)
CHLORIDE: 106 mmol/L (ref 101–111)
CO2: 26 mmol/L (ref 22–32)
CREATININE: 0.7 mg/dL (ref 0.44–1.00)
Calcium: 9.3 mg/dL (ref 8.9–10.3)
GFR calc Af Amer: >60 mL/min (ref >60)
GFR calc non Af Amer: >60 mL/min (ref >60)
Glucose, Bld: 92 mg/dL (ref 65–99)
Potassium: 4.2 mmol/L (ref 3.5–5.1)
Sodium: 137 mmol/L (ref 135–145)

## 2015-09-09 MED ORDER — SODIUM CHLORIDE 0.9 % IV BOLUS (SEPSIS)
1000.0000 mL | Freq: Once | INTRAVENOUS | Status: AC
Start: 2015-09-09 — End: 2015-09-10
  Administered 2015-09-09: 1000 mL via INTRAVENOUS

## 2015-09-09 NOTE — ED Notes (Signed)
Patient reports she has been short of breath and upper chest pain with mucus build up for several days.  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

## 2015-09-10 LAB — HEPATIC FUNCTION PANEL
ALT: 10 U/L — AB (ref 14–54)
AST: 16 U/L (ref 15–41)
Albumin: 3.7 g/dL (ref 3.5–5.0)
Alkaline Phosphatase: 59 U/L (ref 38–126)
Bilirubin, Direct: <0.1 mg/dL — ABNORMAL LOW (ref 0.1–0.5)
TOTAL PROTEIN: 7.2 g/dL (ref 6.5–8.1)
Total Bilirubin: 0.3 mg/dL (ref 0.3–1.2)

## 2015-09-10 LAB — TROPONIN I: Troponin I: <0.03 ng/mL (ref <0.031)

## 2015-09-10 LAB — HCG, QUANTITATIVE, PREGNANCY: hCG, Beta Chain, Quant, S: 40167 m[IU]/mL — ABNORMAL HIGH (ref <5)

## 2015-09-10 LAB — FIBRIN DERIVATIVES D-DIMER (ARMC ONLY): FIBRIN DERIVATIVES D-DIMER (ARMC): 432 (ref 0–499)

## 2015-09-10 MED ORDER — AMOXICILLIN 875 MG PO TABS: 875.0000 mg | ORAL_TABLET | Freq: Two times a day (BID) | ORAL | Status: AC

## 2015-09-10 NOTE — Discharge Instructions (Signed)
Sinusitis °Sinusitis is redness, soreness, and inflammation of the paranasal sinuses. Paranasal sinuses are air pockets within the bones of your face (beneath the eyes, the middle of the forehead, or above the eyes). In healthy paranasal sinuses, mucus is able to drain out, and air is able to circulate through them by way of your nose. However, when your paranasal sinuses are inflamed, mucus and air can become trapped. This can allow bacteria and other germs to grow and cause infection. °Sinusitis can develop quickly and last only a short time (acute) or continue over a long period (chronic). Sinusitis that lasts for more than 12 weeks is considered chronic.  °CAUSES  °Causes of sinusitis include: °· Allergies. °· Structural abnormalities, such as displacement of the cartilage that separates your nostrils (deviated septum), which can decrease the air flow through your nose and sinuses and affect sinus drainage. °· Functional abnormalities, such as when the small hairs (cilia) that line your sinuses and help remove mucus do not work properly or are not present. °SIGNS AND SYMPTOMS  °Symptoms of acute and chronic sinusitis are the same. The primary symptoms are pain and pressure around the affected sinuses. Other symptoms include: °· Upper toothache. °· Earache. °· Headache. °· Bad breath. °· Decreased sense of smell and taste. °· A cough, which worsens when you are lying flat. °· Fatigue. °· Fever. °· Thick drainage from your nose, which often is green and may contain pus (purulent). °· Swelling and warmth over the affected sinuses. °DIAGNOSIS  °Your health care provider will perform a physical exam. During the exam, your health care provider may: °· Look in your nose for signs of abnormal growths in your nostrils (nasal polyps). °· Tap over the affected sinus to check for signs of infection. °· View the inside of your sinuses (endoscopy) using an imaging device that has a light attached (endoscope). °If your health  care provider suspects that you have chronic sinusitis, one or more of the following tests may be recommended: °· Allergy tests. °· Nasal culture. A sample of mucus is taken from your nose, sent to a lab, and screened for bacteria. °· Nasal cytology. A sample of mucus is taken from your nose and examined by your health care provider to determine if your sinusitis is related to an allergy. °TREATMENT  °Most cases of acute sinusitis are related to a viral infection and will resolve on their own within 10 days. Sometimes medicines are prescribed to help relieve symptoms (pain medicine, decongestants, nasal steroid sprays, or saline sprays).  °However, for sinusitis related to a bacterial infection, your health care provider will prescribe antibiotic medicines. These are medicines that will help kill the bacteria causing the infection.  °Rarely, sinusitis is caused by a fungal infection. In theses cases, your health care provider will prescribe antifungal medicine. °For some cases of chronic sinusitis, surgery is needed. Generally, these are cases in which sinusitis recurs more than 3 times per year, despite other treatments. °HOME CARE INSTRUCTIONS  °· Drink plenty of water. Water helps thin the mucus so your sinuses can drain more easily. °· Use a humidifier. °· Inhale steam 3 to 4 times a day (for example, sit in the bathroom with the shower running). °· Apply a warm, moist washcloth to your face 3 to 4 times a day, or as directed by your health care provider. °· Use saline nasal sprays to help moisten and clean your sinuses. °· Take medicines only as directed by your health care provider. °·   If you were prescribed either an antibiotic or antifungal medicine, finish it all even if you start to feel better. SEEK IMMEDIATE MEDICAL CARE IF:  You have increasing pain or severe headaches.  You have nausea, vomiting, or drowsiness.  You have swelling around your face.  You have vision problems.  You have a stiff  neck.  You have difficulty breathing. MAKE SURE YOU:   Understand these instructions.  Will watch your condition.  Will get help right away if you are not doing well or get worse. Document Released: 12/01/2005 Document Revised: 04/17/2014 Document Reviewed: 12/16/2011 [facility] Patient Information 2015 [place], [place]. This information is not intended to replace advice given to you by your health care provider. Make sure you discuss any questions you have with your health care provider.  Syncope Syncope is a medical term for fainting or passing out. This means you lose consciousness and drop to the ground. People are generally unconscious for less than 5 minutes. You may have some muscle twitches for up to 15 seconds before waking up and returning to normal. Syncope occurs more often in older adults, but it can happen to anyone. While most causes of syncope are not dangerous, syncope can be a sign of a serious medical problem. It is important to seek medical care.  CAUSES  Syncope is caused by a sudden drop in blood flow to the brain. The specific cause is often not determined. Factors that can bring on syncope include:  Taking medicines that lower blood pressure.  Sudden changes in posture, such as standing up quickly.  Taking more medicine than prescribed.  Standing in one place for too long.  Seizure disorders.  Dehydration and excessive exposure to heat.  Low blood sugar (hypoglycemia).  Straining to have a bowel movement.  Heart disease, irregular heartbeat, or other circulatory problems.  Fear, emotional distress, seeing blood, or severe pain. SYMPTOMS  Right before fainting, you may:  Feel dizzy or light-headed.  Feel nauseous.  See all white or all black in your field of vision.  Have cold, clammy skin. DIAGNOSIS  Your health care provider will ask about your symptoms, perform a physical exam, and perform an electrocardiogram (ECG) to record the electrical  activity of your heart. Your health care provider may also perform other heart or blood tests to determine the cause of your syncope which may include:  Transthoracic echocardiogram (TTE). During echocardiography, sound waves are used to evaluate how blood flows through your heart.  Transesophageal echocardiogram (TEE).  Cardiac monitoring. This allows your health care provider to monitor your heart rate and rhythm in real time.  Holter monitor. This is a portable device that records your heartbeat and can help diagnose heart arrhythmias. It allows your health care provider to track your heart activity for several days, if needed.  Stress tests by exercise or by giving medicine that makes the heart beat faster. TREATMENT  In most cases, no treatment is needed. Depending on the cause of your syncope, your health care provider may recommend changing or stopping some of your medicines. HOME CARE INSTRUCTIONS  Have someone stay with you until you feel stable.  Do not drive, use machinery, or play sports until your health care provider says it is okay.  Keep all follow-up appointments as directed by your health care provider.  Lie down right away if you start feeling like you might faint. Breathe deeply and steadily. Wait until all the symptoms have passed.  Drink enough fluids to keep your  urine clear or pale yellow.  If you are taking blood pressure or heart medicine, get up slowly and take several minutes to sit and then stand. This can reduce dizziness. SEEK IMMEDIATE MEDICAL CARE IF:   You have a severe headache.  You have unusual pain in the chest, abdomen, or back.  You are bleeding from your mouth or rectum, or you have black or tarry stool.  You have an irregular or very fast heartbeat.  You have pain with breathing.  You have repeated fainting or seizure-like jerking during an episode.  You faint when sitting or lying down.  You have confusion.  You have trouble  walking.  You have severe weakness.  You have vision problems. If you fainted, call your local emergency services (911 in [place] Do not drive yourself to the hospital.  MAKE SURE YOU:  Understand these instructions.  Will watch your condition.  Will get help right away if you are not doing well or get worse. Document Released: 12/01/2005 Document Revised: 12/06/2013 Document Reviewed: 01/30/2012 [facility] Patient Information 2015 [place], [place]. This information is not intended to replace advice given to you by your health care provider. Make sure you discuss any questions you have with your health care provider.  Upper Respiratory Infection, Adult An upper respiratory infection (URI) is also sometimes known as the common cold. The upper respiratory tract includes the nose, sinuses, throat, trachea, and bronchi. Bronchi are the airways leading to the lungs. Most people improve within 1 week, but symptoms can last up to 2 weeks. A residual cough may last even longer.  CAUSES Many different viruses can infect the tissues lining the upper respiratory tract. The tissues become irritated and inflamed and often become very moist. Mucus production is also common. A cold is contagious. You can easily spread the virus to others by oral contact. This includes kissing, sharing a glass, coughing, or sneezing. Touching your mouth or nose and then touching a surface, which is then touched by another person, can also spread the virus. SYMPTOMS  Symptoms typically develop 1 to 3 days after you come in contact with a cold virus. Symptoms vary from person to person. They may include:  Runny nose.  Sneezing.  Nasal congestion.  Sinus irritation.  Sore throat.  Loss of voice (laryngitis).  Cough.  Fatigue.  Muscle aches.  Loss of appetite.  Headache.  Low-grade fever. DIAGNOSIS  You might diagnose your own cold based on familiar symptoms, since most people get a cold 2 to 3 times a year.  Your caregiver can confirm this based on your exam. Most importantly, your caregiver can check that your symptoms are not due to another disease such as strep throat, sinusitis, pneumonia, asthma, or epiglottitis. Blood tests, throat tests, and X-rays are not necessary to diagnose a common cold, but they may sometimes be helpful in excluding other more serious diseases. Your caregiver will decide if any further tests are required. RISKS AND COMPLICATIONS  You may be at risk for a more severe case of the common cold if you smoke cigarettes, have chronic heart disease (such as heart failure) or lung disease (such as asthma), or if you have a weakened immune system. The very young and very old are also at risk for more serious infections. Bacterial sinusitis, middle ear infections, and bacterial pneumonia can complicate the common cold. The common cold can worsen asthma and chronic obstructive pulmonary disease (COPD). Sometimes, these complications can require emergency medical care and may be life-threatening.  PREVENTION  The best way to protect against getting a cold is to practice good hygiene. Avoid oral or hand contact with people with cold symptoms. Wash your hands often if contact occurs. There is no clear evidence that vitamin C, vitamin E, echinacea, or exercise reduces the chance of developing a cold. However, it is always recommended to get plenty of rest and practice good nutrition. TREATMENT  Treatment is directed at relieving symptoms. There is no cure. Antibiotics are not effective, because the infection is caused by a virus, not by bacteria. Treatment may include:  Increased fluid intake. Sports drinks offer valuable electrolytes, sugars, and fluids.  Breathing heated mist or steam (vaporizer or shower).  Eating chicken soup or other clear broths, and maintaining good nutrition.  Getting plenty of rest.  Using gargles or lozenges for comfort.  Controlling fevers with ibuprofen or  acetaminophen as directed by your caregiver.  Increasing usage of your inhaler if you have asthma. Zinc gel and zinc lozenges, taken in the first 24 hours of the common cold, can shorten the duration and lessen the severity of symptoms. Pain medicines may help with fever, muscle aches, and throat pain. A variety of non-prescription medicines are available to treat congestion and runny nose. Your caregiver can make recommendations and may suggest nasal or lung inhalers for other symptoms.  HOME CARE INSTRUCTIONS   Only take over-the-counter or prescription medicines for pain, discomfort, or fever as directed by your caregiver.  Use a warm mist humidifier or inhale steam from a shower to increase air moisture. This may keep secretions moist and make it easier to breathe.  Drink enough water and fluids to keep your urine clear or pale yellow.  Rest as needed.  Return to work when your temperature has returned to normal or as your caregiver advises. You may need to stay home longer to avoid infecting others. You can also use a face mask and careful hand washing to prevent spread of the virus. SEEK MEDICAL CARE IF:   After the first few days, you feel you are getting worse rather than better.  You need your caregiver's advice about medicines to control symptoms.  You develop chills, worsening shortness of breath, or brown or red sputum. These may be signs of pneumonia.  You develop yellow or brown nasal discharge or pain in the face, especially when you bend forward. These may be signs of sinusitis.  You develop a fever, swollen neck glands, pain with swallowing, or white areas in the back of your throat. These may be signs of strep throat. SEEK IMMEDIATE MEDICAL CARE IF:   You have a fever.  You develop severe or persistent headache, ear pain, sinus pain, or chest pain.  You develop wheezing, a prolonged cough, cough up blood, or have a change in your usual mucus (if you have chronic lung  disease).  You develop sore muscles or a stiff neck. Document Released: 05/27/2001 Document Revised: 02/23/2012 Document Reviewed: 03/08/2014 [facility] Patient Information 2015 [place], [place]. This information is not intended to replace advice given to you by your health care provider. Make sure you discuss any questions you have with your health care provider.

## 2015-09-10 NOTE — ED Provider Notes (Signed)
[facility] Emergency Department Provider Note  ____________________________________________  Time seen: Approximately 2315 AM  I have reviewed the triage vital signs and the nursing notes.   HISTORY  Chief Complaint Loss of Consciousness and Shortness of Breath    HPI Maria Sullivan is a 19 y.o. female who comes in reporting that she has not been feeling well for the last 3-4 days. The patient reports that some shortness of breath chest tightness migraines and head and ear pressure. The patient also reports that she's been coughing up mucus. She reports she was on EMS call today when she took a needed take someone's blood pressure and passed out. The patient denies feeling dizzy or chest pain when she had the syncopal event. The patient was out for a few minutes. She reports she continues to feel a little bit short of breath. The patient reports that she has been eating on and off and she can hold food down sometimes. She reports that her boyfriend's mother was sick. She's had some coming and going lower abdominal spasms last 1-2 weeks with some nausea as well. The patient came in for evaluation due to her syncopal event.   Past Medical History  Diagnosis Date  . Ovarian cyst    eye gr  There are no active problems to display for this patient.   Past Surgical History  Procedure Laterality Date  . Ovarian cyst surgery      Current Outpatient Rx  Name  Route  Sig  Dispense  Refill  . Prenatal Vit-Fe Fumarate-FA (PRENATAL VITAMIN PO)   Oral   Take 1 tablet by mouth daily.         Marland Kitchen amoxicillin (AMOXIL) 875 MG tablet   Oral   Take 1 tablet (875 mg total) by mouth 2 (two) times daily.   20 tablet   0     Allergies Review of patient's allergies indicates no known allergies.  No family history on file.  Social History Social History  Substance Use Topics  . Smoking status: Never Smoker   . Smokeless tobacco: Never Used  . Alcohol Use: No     Review of Systems Constitutional: No fever/chills Eyes: No visual changes. ENT: No sore throat. Cardiovascular: Chest tightness Respiratory: Shortness of breath Gastrointestinal: No abdominal pain.  No nausea, no vomiting.  No diarrhea.  No constipation. Genitourinary: Negative for dysuria. Musculoskeletal: Negative for back pain. Skin: Negative for rash. Neurological: Syncope, headache  10-point ROS otherwise negative.  ____________________________________________   PHYSICAL EXAM:  VITAL SIGNS: ED Triage Vitals  Enc Vitals Group     BP 09/09/15 2049 103/70 mmHg     Pulse Rate 09/09/15 2049 88     Resp 09/09/15 2049 18     Temp 09/09/15 2049 98.4 F (36.9 C)     Temp Source 09/09/15 2049 Oral     SpO2 09/09/15 2049 100 %     Weight 09/09/15 2049 162 lb (73.483 kg)     Height 09/09/15 2049 6' (1.829 m)     Head Cir --      Peak Flow --      Pain Score 09/09/15 2050 5     Pain Loc --      Pain Edu? --      Excl. in GC? --     Constitutional: Alert and oriented. Well appearing and in mild distress. Eyes: Conjunctivae are normal. PERRL. EOMI. Head: Atraumatic. Frontal and ethmoid sinus tenderness to palpation Nose: No congestion/rhinnorhea. Mouth/Throat:  Mucous membranes are moist.  Oropharynx non-erythematous. Cardiovascular: Normal rate, regular rhythm. Grossly normal heart sounds.  Good peripheral circulation. Respiratory: Normal respiratory effort.  No retractions. Lungs CTAB. Gastrointestinal: Soft and nontender. No distention. Positive bowel sounds Musculoskeletal: No lower extremity tenderness nor edema.  Neurologic:  Normal speech and language. No gross focal neurologic deficits are appreciated.  Skin:  Skin is warm, dry and intact.  Psychiatric: Mood and affect are normal.   ____________________________________________   LABS (all labs ordered are listed, but only abnormal results are displayed)  Labs Reviewed  HCG, QUANTITATIVE, PREGNANCY -  Abnormal; Notable for the following:    hCG, Beta Chain, Quant, S [id number] (*)    All other components within normal limits  HEPATIC FUNCTION PANEL - Abnormal; Notable for the following:    ALT 10 (*)    Bilirubin, Direct <0.1 (*)    All other components within normal limits  BASIC METABOLIC PANEL  CBC  FIBRIN DERIVATIVES D-DIMER ([facility] ONLY)  TROPONIN I  CBG MONITORING, ED   ____________________________________________  EKG  ED ECG REPORT I, Rebecka Apley, the attending physician, personally viewed and interpreted this ECG.   Date: 09/09/2015  EKG Time: 2102  Rate: 71  Rhythm: normal sinus rhythm  Axis: normal  Intervals:none  ST&T Change: none  ____________________________________________  RADIOLOGY  CXR: Normal chest radiograph with no active cardiopulmonary disease identified ____________________________________________   PROCEDURES  Procedure(s) performed: None  Critical Care performed: No  ____________________________________________   INITIAL IMPRESSION / ASSESSMENT AND PLAN / ED COURSE  Pertinent labs & imaging results that were available during my care of the patient were reviewed by me and considered in my medical decision making (see chart for details).  This is a 19 year old female who comes in today with a syncopal event. I care of the patient on liter of normal saline as well as did check some orthostatic vital signs. The patient is not orthostatic but reports that she does feel improved after the fluid. The patient had a quantitative beta hCG done which is positive. I went into the room and asked the patient and her boyfriend if they knew she was pregnant and they report that they did. She is approximately [redacted] weeks pregnant. I informed them that the nausea and vomiting which she has had as well as her fatigue can be all side effects of the pregnancy. I informed her that it is important to eat or she could become syncopal. I encouraged the patient to  follow-up with her primary care physician and we'll discharge her to home. I will treat the patient for sinusitis. ____________________________________________   FINAL CLINICAL IMPRESSION(S) / ED DIAGNOSES  Final diagnoses:  Syncope, unspecified syncope type  Acute frontal sinusitis, recurrence not specified  Upper respiratory infection      Rebecka Apley, MD 09/10/15 [phone]

## 2015-10-03 ENCOUNTER — Inpatient Hospital Stay (HOSPITAL_COMMUNITY)
Admission: AD | Admit: 2015-10-03 | Discharge: 2015-10-03 | Disposition: A | Discharge status: Home / Self Care | Payer: Commercial Managed Care - HMO | Source: Ambulatory Visit | Attending: Obstetrics & Gynecology | Admitting: Obstetrics & Gynecology

## 2015-10-03 ENCOUNTER — Encounter (HOSPITAL_COMMUNITY): Payer: Self-pay | Admitting: *Deleted

## 2015-10-03 DIAGNOSIS — R109 Unspecified abdominal pain: Secondary | ICD-10-CM

## 2015-10-03 DIAGNOSIS — Z3A11 11 weeks gestation of pregnancy: Secondary | ICD-10-CM | POA: Diagnosis not present

## 2015-10-03 DIAGNOSIS — R1030 Lower abdominal pain, unspecified: Secondary | ICD-10-CM | POA: Insufficient documentation

## 2015-10-03 DIAGNOSIS — O9989 Other specified diseases and conditions complicating pregnancy, childbirth and the puerperium: Secondary | ICD-10-CM

## 2015-10-03 DIAGNOSIS — O26899 Other specified pregnancy related conditions, unspecified trimester: Secondary | ICD-10-CM

## 2015-10-03 DIAGNOSIS — O219 Vomiting of pregnancy, unspecified: Secondary | ICD-10-CM

## 2015-10-03 DIAGNOSIS — O26891 Other specified pregnancy related conditions, first trimester: Secondary | ICD-10-CM | POA: Insufficient documentation

## 2015-10-03 LAB — WET PREP, GENITAL
CLUE CELLS WET PREP: NONE SEEN
TRICH WET PREP: NONE SEEN
Yeast Wet Prep HPF POC: NONE SEEN

## 2015-10-03 LAB — URINALYSIS, ROUTINE W REFLEX MICROSCOPIC
Bilirubin Urine: NEGATIVE
GLUCOSE, UA: NEGATIVE mg/dL
Hgb urine dipstick: NEGATIVE
Ketones, ur: NEGATIVE mg/dL
LEUKOCYTES UA: NEGATIVE
NITRITE: NEGATIVE
PROTEIN: NEGATIVE mg/dL
Specific Gravity, Urine: >1.03 — ABNORMAL HIGH (ref 1.005–1.030)
Urobilinogen, UA: 0.2 mg/dL (ref 0.0–1.0)
pH: 6 (ref 5.0–8.0)

## 2015-10-03 MED ORDER — PROMETHAZINE HCL 12.5 MG PO TABS: 12.5000 mg | ORAL_TABLET | Freq: Four times a day (QID) | ORAL | Status: AC | PRN

## 2015-10-03 NOTE — Discharge Instructions (Signed)
Eating Plan for Hyperemesis Gravidarum Severe cases of hyperemesis gravidarum can lead to dehydration and malnutrition. The hyperemesis eating plan is one way to lessen the symptoms of nausea and vomiting. It is often used with prescribed medicines to control your symptoms.  WHAT CAN I DO TO RELIEVE MY SYMPTOMS? Listen to your body. Everyone is different and has different preferences. Find what works best for you. Some of the following things may help:  Eat and drink slowly.  Eat 5-6 small meals daily instead of 3 large meals.   Eat crackers before you get out of bed in the morning.   Starchy foods are usually well tolerated (such as cereal, toast, bread, potatoes, pasta, rice, and pretzels).   Ginger may help with nausea. Add  tsp ground ginger to hot tea or choose ginger tea.   Try drinking 100% fruit juice or an electrolyte drink.  Continue to take your prenatal vitamins as directed by your health care provider. If you are having trouble taking your prenatal vitamins, talk with your health care provider about different options.  Include at least 1 serving of protein with your meals and snacks (such as meats or poultry, beans, nuts, eggs, or yogurt). Try eating a protein-rich snack before bed (such as cheese and crackers or a half [place] or peanut butter sandwich). WHAT THINGS SHOULD I AVOID TO REDUCE MY SYMPTOMS? The following things may help reduce your symptoms:  Avoid foods with strong smells. Try eating meals in well-ventilated areas that are free of odors.  Avoid drinking water or other beverages with meals. Try not to drink anything less than 30 minutes before and after meals.  Avoid drinking more than 1 cup of fluid at a time.  Avoid fried or high-fat foods, such as butter and cream sauces.  Avoid spicy foods.  Avoid skipping meals the best you can. Nausea can be more intense on an empty stomach. If you cannot tolerate food at that time, do not force it. Try sucking on  ice chips or other frozen items and make up the calories later.  Avoid lying down within 2 hours after eating.   This information is not intended to replace advice given to you by your health care provider. Make sure you discuss any questions you have with your health care provider.   Document Released: 09/28/2007 Document Revised: 12/06/2013 Document Reviewed: 10/05/2013 Elsevier Interactive Patient Education [employer] Inc[employer] First Trimester of Pregnancy The first trimester of pregnancy is from week 1 until the end of week 12 (months 1 through 3). During this time, your baby will begin to develop inside you. At 6-8 weeks, the eyes and face are formed, and the heartbeat can be seen on ultrasound. At the end of 12 weeks, all the baby's organs are formed. Prenatal care is all the medical care you receive before the birth of your baby. Make sure you get good prenatal care and follow all of your doctor's instructions. HOME CARE  Medicines  Take medicine only as told by your doctor. Some medicines are safe and some are not during pregnancy.  Take your prenatal vitamins as told by your doctor.  Take medicine that helps you poop (stool softener) as needed if your doctor says it is okay. Diet  Eat regular, healthy meals.  Your doctor will tell you the amount of weight gain that is right for you.  Avoid raw meat and uncooked cheese.  If you feel sick to your stomach (nauseous) or throw up (vomit):  Eat 4 or 5 small meals a day instead of 3 large meals.  Try eating a few soda crackers.  Drink liquids between meals instead of during meals.  If you have a hard time pooping (constipation):  Eat high-fiber foods like fresh vegetables, fruit, and whole grains.  Drink enough fluids to keep your pee (urine) clear or pale yellow. Activity and Exercise  Exercise only as told by your doctor. Stop exercising if you have cramps or pain in your lower belly (abdomen) or low back.  Try to avoid  standing for long periods of time. Move your legs often if you must stand in one place for a long time.  Avoid heavy lifting.  Wear low-heeled shoes. Sit and stand up straight.  You can have sex unless your doctor tells you not to. Relief of Pain or Discomfort  Wear a good support bra if your breasts are sore.  Take warm water baths (sitz baths) to soothe pain or discomfort caused by hemorrhoids. Use hemorrhoid cream if your doctor says it is okay.  Rest with your legs raised if you have leg cramps or low back pain.  Wear support hose if you have puffy, bulging veins (varicose veins) in your legs. Raise (elevate) your feet for 15 minutes, 3-4 times a day. Limit salt in your diet. Prenatal Care  Schedule your prenatal visits by the twelfth week of pregnancy.  Write down your questions. Take them to your prenatal visits.  Keep all your prenatal visits as told by your doctor. Safety  Wear your seat belt at all times when driving.  Make a list of emergency phone numbers. The list should include numbers for family, friends, the hospital, and police and fire departments. General Tips  Ask your doctor for a referral to a local prenatal class. Begin classes no later than at the start of month 6 of your pregnancy.  Ask for help if you need counseling or help with nutrition. Your doctor can give you advice or tell you where to go for help.  Do not use hot tubs, steam rooms, or saunas.  Do not douche or use tampons or scented sanitary pads.  Do not cross your legs for long periods of time.  Avoid litter boxes and soil used by cats.  Avoid all smoking, herbs, and alcohol. Avoid drugs not approved by your doctor.  Do not use any tobacco products, including cigarettes, chewing tobacco, and electronic cigarettes. If you need help quitting, ask your doctor. You may get counseling or other support to help you quit.  Visit your dentist. At home, brush your teeth with a soft toothbrush. Be  gentle when you floss. GET HELP IF:  You are dizzy.  You have mild cramps or pressure in your lower belly.  You have a nagging pain in your belly area.  You continue to feel sick to your stomach, throw up, or have watery poop (diarrhea).  You have a bad smelling fluid coming from your vagina.  You have pain with peeing (urination).  You have increased puffiness (swelling) in your face, hands, legs, or ankles. GET HELP RIGHT AWAY IF:   You have a fever.  You are leaking fluid from your vagina.  You have spotting or bleeding from your vagina.  You have very bad belly cramping or pain.  You gain or lose weight rapidly.  You throw up blood. It may look like coffee grounds.  You are around people who have [place] measles, fifth disease, or chickenpox.  You have a very bad headache.  You have shortness of breath.  You have any kind of trauma, such as from a fall or a car accident.   This information is not intended to replace advice given to you by your health care provider. Make sure you discuss any questions you have with your health care provider.   Document Released: 05/19/2008 Document Revised: 12/22/2014 Document Reviewed: 10/11/2013 Elsevier Interactive Patient Education [employer]

## 2015-10-03 NOTE — MAU Note (Signed)
PT  SAYS ON 10-13-   SHE STARTED HAVING   PAIN   LOWER  ABD  CRAMPS  --  LESS  NOW.     NO PNC  PLANS      LAST SEX-  10-18

## 2015-10-03 NOTE — MAU Provider Note (Signed)
History     CSN: [id number]  Arrival date and time: 10/03/15 0155   First Provider Initiated Contact with Patient 10/03/15 0306      No chief complaint on file.  HPI [name] is a 19 y.o. G2P0010 at [redacted]w[redacted]d who presents to MAU today with complaint of intermittent lower abdominal pain tonight. She states last sex was yesterday. She has had some spotting, but none today or for the last few days. She had early [place] in mid-September that showed IUGS without YS or FP. She never followed-up for repeat US for viability. She denies vaginal discharge, UTI symptoms, fever, diarrhea or constipation. She has had nausea intermittently and vomiting once.   OB History    Gravida Para Term Preterm AB TAB SAB Ectopic Multiple Living   0      Past Medical History  Diagnosis Date  . Ovarian cyst     Past Surgical History  Procedure Laterality Date  . Ovarian cyst surgery      History reviewed. No pertinent family history.  Social History  Substance Use Topics  . Smoking status: Never Smoker   . Smokeless tobacco: Never Used  . Alcohol Use: No    Allergies: No Known Allergies  Prescriptions prior to admission  Medication Sig Dispense Refill Last Dose  . Prenatal Vit-Fe Fumarate-FA (PRENATAL VITAMIN PO) Take 1 tablet by mouth daily.   10/03/2015 at Unknown time    Review of Systems  Constitutional: Negative for fever and malaise/fatigue.  Gastrointestinal: Positive for nausea and abdominal pain. Negative for vomiting, diarrhea and constipation.  Genitourinary: Negative for dysuria, urgency and frequency.       Neg - vaginal bleeding, discharge   Physical Exam   Blood pressure 111/66, pulse 90, temperature 98.3 F (36.8 C), temperature source Oral, resp. rate 20, height  (1.753 m), weight 157 lb 8 oz (71.442 kg), last menstrual period 07/16/2015.  Physical Exam  Nursing note and vitals reviewed. Constitutional: She is oriented to person, place, and  time. She appears well-developed and well-nourished. No distress.  HENT:  Head: Normocephalic and atraumatic.  Cardiovascular: Normal rate.   Respiratory: Effort normal.  GI: Soft. She exhibits no distension and no mass. There is no tenderness. There is no rebound and no guarding.  Genitourinary: Uterus is enlarged (appropriate for GA). Uterus is not tender. Cervix exhibits no motion tenderness. Right adnexum displays no mass and no tenderness. Left adnexum displays no mass and no tenderness. No bleeding in the vagina. Vaginal discharge (small amount of white discharge noted) found.  Neurological: She is alert and oriented to person, place, and time.  Skin: Skin is warm and dry. No erythema.  Psychiatric: She has a normal mood and affect.  Cervix: closed, thick, posterior  Results for orders placed or performed during the hospital encounter of 10/03/15 (from the past 24 hour(s))  Urinalysis, Routine w reflex microscopic (not at [facility])     Status: Abnormal   Collection Time: 10/03/15  2:15 AM  Result Value Ref Range   Color, Urine YELLOW YELLOW   APPearance CLEAR CLEAR   Specific Gravity, Urine >1.030 (H) 1.005 - 1.030   pH 6.0 5.0 - 8.0   Glucose, UA NEGATIVE NEGATIVE mg/dL   Hgb urine dipstick NEGATIVE NEGATIVE   Bilirubin Urine NEGATIVE NEGATIVE   Ketones, ur NEGATIVE NEGATIVE mg/dL   Protein, ur NEGATIVE NEGATIVE mg/dL   Urobilinogen, UA 0.2 0.0 - 1.0 mg/dL  Nitrite NEGATIVE NEGATIVE   Leukocytes, UA NEGATIVE NEGATIVE  Wet prep, genital     Status: Abnormal   Collection Time: 10/03/15  3:32 AM  Result Value Ref Range   Yeast Wet Prep HPF POC NONE SEEN NONE SEEN   Trich, Wet Prep NONE SEEN NONE SEEN   Clue Cells Wet Prep HPF POC NONE SEEN NONE SEEN   WBC, Wet Prep HPF POC FEW (A) NONE SEEN     MAU Course  Procedures None  MDM FHR - 170 bpm with doppler UA, wet prep, GC/chlamydia today  Assessment and Plan  A: SIUP at 6425w2d Abdominal pain in  pregnancy  P: Discharge home Rx for Phenergan given to patient Tylenol PRN for pain advised First trimetser precautions discussed GC/Chlamydia pending Patient advised to increase PO hydration as tolerated Patient advised to follow-up with OB provider of choice to start prenatal care Patient may return to MAU as needed or if her condition were to change or worsen   Marny LowensteinJulie N Klea Nall, PA-C  10/03/2015, 3:53 AM

## 2015-10-04 LAB — GC/CHLAMYDIA PROBE AMP (~~LOC~~) NOT AT ARMC
Chlamydia: NEGATIVE
Neisseria Gonorrhea: NEGATIVE

## 2015-10-30 ENCOUNTER — Encounter: Payer: Self-pay | Admitting: Emergency Medicine

## 2015-10-30 DIAGNOSIS — R55 Syncope and collapse: Secondary | ICD-10-CM | POA: Diagnosis not present

## 2015-10-30 DIAGNOSIS — O209 Hemorrhage in early pregnancy, unspecified: Secondary | ICD-10-CM | POA: Diagnosis not present

## 2015-10-30 DIAGNOSIS — O9989 Other specified diseases and conditions complicating pregnancy, childbirth and the puerperium: Secondary | ICD-10-CM | POA: Insufficient documentation

## 2015-10-30 DIAGNOSIS — Z3A15 15 weeks gestation of pregnancy: Secondary | ICD-10-CM | POA: Diagnosis not present

## 2015-10-30 DIAGNOSIS — Z79899 Other long term (current) drug therapy: Secondary | ICD-10-CM | POA: Diagnosis not present

## 2015-10-30 NOTE — ED Notes (Signed)
Patient ambulatory to triage with steady gait, without difficulty or distress noted; pt reports syncopal episode PTA--denies any injury; c/o left sided CP, nonradiating, with no accomp symptoms; denies hx of same

## 2015-10-31 ENCOUNTER — Emergency Department: Payer: Commercial Managed Care - HMO

## 2015-10-31 ENCOUNTER — Emergency Department
Admission: EM | Admit: 2015-10-31 | Discharge: 2015-10-31 | Disposition: A | Discharge status: Home / Self Care | Payer: Commercial Managed Care - HMO | Attending: Emergency Medicine | Admitting: Emergency Medicine

## 2015-10-31 DIAGNOSIS — R55 Syncope and collapse: Secondary | ICD-10-CM

## 2015-10-31 DIAGNOSIS — O26899 Other specified pregnancy related conditions, unspecified trimester: Secondary | ICD-10-CM

## 2015-10-31 DIAGNOSIS — I951 Orthostatic hypotension: Secondary | ICD-10-CM

## 2015-10-31 DIAGNOSIS — R109 Unspecified abdominal pain: Secondary | ICD-10-CM

## 2015-10-31 LAB — URINALYSIS COMPLETE WITH MICROSCOPIC (ARMC ONLY)
Bilirubin Urine: NEGATIVE
GLUCOSE, UA: NEGATIVE mg/dL
HGB URINE DIPSTICK: NEGATIVE
Ketones, ur: NEGATIVE mg/dL
Leukocytes, UA: NEGATIVE
Nitrite: NEGATIVE
PROTEIN: NEGATIVE mg/dL
RBC / HPF: NONE SEEN RBC/hpf (ref 0–5)
Specific Gravity, Urine: 1.005 (ref 1.005–1.030)
pH: 6 (ref 5.0–8.0)

## 2015-10-31 LAB — CBC
HEMATOCRIT: 36.7 % (ref 35.0–47.0)
HEMOGLOBIN: 12.3 g/dL (ref 12.0–16.0)
MCH: 30.9 pg (ref 26.0–34.0)
MCHC: 33.5 g/dL (ref 32.0–36.0)
MCV: 92 fL (ref 80.0–100.0)
Platelets: 211 10*3/uL (ref 150–440)
RBC: 3.99 MIL/uL (ref 3.80–5.20)
RDW: 12.7 % (ref 11.5–14.5)
WBC: 6.8 10*3/uL (ref 3.6–11.0)

## 2015-10-31 LAB — BASIC METABOLIC PANEL
ANION GAP: 4 — AB (ref 5–15)
BUN: 9 mg/dL (ref 6–20)
CHLORIDE: 107 mmol/L (ref 101–111)
CO2: 24 mmol/L (ref 22–32)
Calcium: 9.1 mg/dL (ref 8.9–10.3)
Creatinine, Ser: 0.59 mg/dL (ref 0.44–1.00)
GFR calc Af Amer: >60 mL/min (ref >60)
GFR calc non Af Amer: >60 mL/min (ref >60)
GLUCOSE: 80 mg/dL (ref 65–99)
POTASSIUM: 3.6 mmol/L (ref 3.5–5.1)
Sodium: 135 mmol/L (ref 135–145)

## 2015-10-31 LAB — HCG, QUANTITATIVE, PREGNANCY: hCG, Beta Chain, Quant, S: 65031 m[IU]/mL — ABNORMAL HIGH (ref <5)

## 2015-10-31 LAB — WET PREP, GENITAL
Clue Cells Wet Prep HPF POC: NONE SEEN
SPERM: NONE SEEN
TRICH WET PREP: NONE SEEN
YEAST WET PREP: NONE SEEN

## 2015-10-31 LAB — TROPONIN I

## 2015-10-31 MED ORDER — SODIUM CHLORIDE 0.9 % IV BOLUS (SEPSIS)
1000.0000 mL | Freq: Once | INTRAVENOUS | Status: AC
  Administered 2015-10-31: 1000 mL via INTRAVENOUS

## 2015-10-31 MED ORDER — METOCLOPRAMIDE HCL 5 MG/ML IJ SOLN
10.0000 mg | Freq: Once | INTRAMUSCULAR | Status: AC
Start: 2015-10-31 — End: 2015-10-31
  Administered 2015-10-31: 10 mg via INTRAVENOUS
  Filled 2015-10-31: qty 2

## 2015-10-31 NOTE — ED Provider Notes (Signed)
[facility] Emergency Department Provider Note  ____________________________________________  Time seen: Approximately 206 AM  I have reviewed the triage vital signs and the nursing notes.   HISTORY  Chief Complaint Loss of Consciousness    HPI Norely Schlick is a 19 y.o. female who comes into the hospital today with a syncopal episode. The patient reports that she was standing in the shower and started feeling lightheaded. The patient got out and syncopized onto the bed and then fell onto the floor. The patient according to the patient's significant other was only unconscious for about 10-15 seconds. She did not have any seizure activity and came to without any difficulty. According to the patient and the family the patient has been having multiple syncopal events over the last month. She does have some mild left-sided chest pain that hurts when she moves since the fall. She has also been having some vaginal bleeding last week and some severe lower abdominal pain. The patient reports that she ate and drank well today. The patient is she reports 3 months pregnant but has not seen an OB/GYN in some time. The patient rates her pain currently as a 7 out of 10 in intensity. The patient has not taken any medication for her abdominal pain. She reports that she did not come into the hospital as she does not trust doctors and does not have a primary care physician here in [place]. The patient is here for evaluation of her syncope as well as some abdominal pain.   Past Medical History  Diagnosis Date  . Ovarian cyst     There are no active problems to display for this patient.   Past Surgical History  Procedure Laterality Date  . Ovarian cyst surgery      Current Outpatient Rx  Name  Route  Sig  Dispense  Refill  . ibuprofen (ADVIL,MOTRIN) 800 MG tablet   Oral   Take 800 mg by mouth every 8 (eight) hours as needed.         . Prenatal Vit-Fe Fumarate-FA  (PRENATAL VITAMIN PO)   Oral   Take 1 tablet by mouth daily.         . promethazine (PHENERGAN) 12.5 MG tablet   Oral   Take 1 tablet (12.5 mg total) by mouth every 6 (six) hours as needed for nausea or vomiting. Patient not taking: Reported on 10/31/2015   30 tablet   0     Allergies Review of patient's allergies indicates no known allergies.  No family history on file.  Social History Social History  Substance Use Topics  . Smoking status: Never Smoker   . Smokeless tobacco: Never Used  . Alcohol Use: No    Review of Systems Constitutional: No fever/chills Eyes: No visual changes. ENT: No sore throat. Cardiovascular:  chest pain. Respiratory: Denies shortness of breath. Gastrointestinal:  abdominal pain.  No nausea, no vomiting.  No diarrhea.  No constipation. Genitourinary: Vaginal bleeding Musculoskeletal: Negative for back pain. Skin: Negative for rash. Neurological: Syncope  10-point ROS otherwise negative.  ____________________________________________   PHYSICAL EXAM:  VITAL SIGNS: ED Triage Vitals  Enc Vitals Group     BP 10/30/15 2339 115/80 mmHg     Pulse Rate 10/30/15 2339 89     Resp 10/30/15 2339 18     Temp 10/30/15 2339 98 F (36.7 C)     Temp Source 10/30/15 2339 Oral     SpO2 10/30/15 2339 100 %     Weight  10/30/15 2339 164 lb (74.39 kg)     Height 10/30/15 2339 6' (1.829 m)     Head Cir --      Peak Flow --      Pain Score 10/30/15 2339 7     Pain Loc --      Pain Edu? --      Excl. in GC? --     Constitutional: Alert and oriented. Well appearing and in mild distress. Eyes: Conjunctivae are normal. PERRL. EOMI. Head: Atraumatic. Nose: No congestion/rhinnorhea. Mouth/Throat: Mucous membranes are moist.  Oropharynx non-erythematous. Neck: No cervical spine tenderness to palpation. Cardiovascular: Normal rate, regular rhythm. Grossly normal heart sounds.  Good peripheral circulation. Respiratory: Normal respiratory effort.  No  retractions. Lungs CTAB. Gastrointestinal: Soft with suprapubic tenderness to palpation. No distention. Positive bowel sounds Genitourinary: Normal external genitalia, minimal discharge inside of the vaginal vault. No cervical motion tenderness to palpation. Midline abd pain to palpation Musculoskeletal: No lower extremity tenderness nor edema.   Neurologic:  Normal speech and language. No gross focal neurologic deficits are appreciated.  Skin:  Skin is warm, dry and intact.  Psychiatric: Mood and affect are normal.   ____________________________________________   LABS (all labs ordered are listed, but only abnormal results are displayed)  Labs Reviewed  WET PREP, GENITAL - Abnormal; Notable for the following:    WBC, Wet Prep HPF POC FEW (*)    All other components within normal limits  BASIC METABOLIC PANEL - Abnormal; Notable for the following:    Anion gap 4 (*)    All other components within normal limits  URINALYSIS COMPLETEWITH MICROSCOPIC ([facility] ONLY) - Abnormal; Notable for the following:    Color, Urine STRAW (*)    APPearance CLEAR (*)    Bacteria, UA RARE (*)    Squamous Epithelial / LPF 0-5 (*)    All other components within normal limits  HCG, QUANTITATIVE, PREGNANCY - Abnormal; Notable for the following:    hCG, Beta Chain, Quant, [place] [address] (*)    All other components within normal limits  CBC  TROPONIN I  POC URINE PREG, ED   ____________________________________________  EKG  ED ECG REPORT I, Rebecka Apley, the attending physician, personally viewed and interpreted this ECG.   Date: 10/30/2015  EKG Time: 2353  Rate: 86  Rhythm: normal sinus rhythm  Axis: right axis deviation  Intervals:none  ST&T Change: none  ____________________________________________  RADIOLOGY  Chest x-ray: No acute process, no displaced rib fracture Ultrasound: Single live intrauterine pregnancy without immediate complications, gestational age [redacted] weeks and 5  days. ____________________________________________   PROCEDURES  Procedure(s) performed: None  Critical Care performed: No  ____________________________________________   INITIAL IMPRESSION / ASSESSMENT AND PLAN / ED COURSE  Pertinent labs & imaging results that were available during my care of the patient were reviewed by me and considered in my medical decision making (see chart for details).  This is a 19 year old female who comes in with syncopal event today. The patient is a [redacted] weeks pregnant. We did do orthostatic vital signs and the patient became tachycardic after standing. I did give the patient a liter of normal saline. She does not feel dizzy at this time. I informed her that with pregnancy especially in the second trimester she would be more prone to hypotension and needs to stay fully hydrated. The patient's blood work is unremarkable and her pain may be due to her growing uterus. I will discharge the patient to have her follow-up  with OB/GYN. The patient is supposed to be moving to another state and reports that she has a physician set up and that other state. The patient had no further complaints and will be discharged home. ____________________________________________   FINAL CLINICAL IMPRESSION(S) / ED DIAGNOSES  Final diagnoses:  Syncope, unspecified syncope type  Orthostatic syncope  Abdominal pain in pregnancy      Rebecka ApleyAllison P Oree Hislop, MD 10/31/15 [phone]

## 2015-10-31 NOTE — ED Notes (Signed)

## 2015-10-31 NOTE — Discharge Instructions (Signed)
Syncope °Syncope is a medical term for fainting or passing out. This means you lose consciousness and drop to the ground. People are generally unconscious for less than 5 minutes. You may have some muscle twitches for up to 15 seconds before waking up and returning to normal. Syncope occurs more often in older adults, but it can happen to anyone. While most causes of syncope are not dangerous, syncope can be a sign of a serious medical problem. It is important to seek medical care.  °CAUSES  °Syncope is caused by a sudden drop in blood flow to the brain. The specific cause is often not determined. Factors that can bring on syncope include: °· Taking medicines that lower blood pressure. °· Sudden changes in posture, such as standing up quickly. °· Taking more medicine than prescribed. °· Standing in one place for too long. °· Seizure disorders. °· Dehydration and excessive exposure to heat. °· Low blood sugar (hypoglycemia). °· Straining to have a bowel movement. °· Heart disease, irregular heartbeat, or other circulatory problems. °· Fear, emotional distress, seeing blood, or severe pain. °SYMPTOMS  °Right before fainting, you may: °· Feel dizzy or light-headed. °· Feel nauseous. °· See all white or all black in your field of vision. °· Have cold, clammy skin. °DIAGNOSIS  °Your health care provider will ask about your symptoms, perform a physical exam, and perform an electrocardiogram (ECG) to record the electrical activity of your heart. Your health care provider may also perform other heart or blood tests to determine the cause of your syncope which may include: °· Transthoracic echocardiogram (TTE). During echocardiography, sound waves are used to evaluate how blood flows through your heart. °· Transesophageal echocardiogram (TEE). °· Cardiac monitoring. This allows your health care provider to monitor your heart rate and rhythm in real time. °· Holter monitor. This is a portable device that records your  heartbeat and can help diagnose heart arrhythmias. It allows your health care provider to track your heart activity for several days, if needed. °· Stress tests by exercise or by giving medicine that makes the heart beat faster. °TREATMENT  °In most cases, no treatment is needed. Depending on the cause of your syncope, your health care provider may recommend changing or stopping some of your medicines. °HOME CARE INSTRUCTIONS °· Have someone stay with you until you feel stable. °· Do not drive, use machinery, or play sports until your health care provider says it is okay. °· Keep all follow-up appointments as directed by your health care provider. °· Lie down right away if you start feeling like you might faint. Breathe deeply and steadily. Wait until all the symptoms have passed. °· Drink enough fluids to keep your urine clear or pale yellow. °· If you are taking blood pressure or heart medicine, get up slowly and take several minutes to sit and then stand. This can reduce dizziness. °SEEK IMMEDIATE MEDICAL CARE IF:  °· You have a severe headache. °· You have unusual pain in the chest, abdomen, or back. °· You are bleeding from your mouth or rectum, or you have black or tarry stool. °· You have an irregular or very fast heartbeat. °· You have pain with breathing. °· You have repeated fainting or seizure-like jerking during an episode. °· You faint when sitting or lying down. °· You have confusion. °· You have trouble walking. °· You have severe weakness. °· You have vision problems. °If you fainted, call your local emergency services (911 in [place] Do not drive   yourself to the hospital.    This information is not intended to replace advice given to you by your health care provider. Make sure you discuss any questions you have with your health care provider.   Document Released: 12/01/2005 Document Revised: 04/17/2015 Document Reviewed: 01/30/2012 Elsevier Interactive Patient Education 2016 [employer]  Abdominal Pain, Adult Many things can cause abdominal pain. Usually, abdominal pain is not caused by a disease and will improve without treatment. It can often be observed and treated at home. Your health care provider will do a physical exam and possibly order blood tests and X-rays to help determine the seriousness of your pain. However, in many cases, more time must pass before a clear cause of the pain can be found. Before that point, your health care provider may not know if you need more testing or further treatment. HOME CARE INSTRUCTIONS Monitor your abdominal pain for any changes. The following actions may help to alleviate any discomfort you are experiencing:  Only take over-the-counter or prescription medicines as directed by your health care provider.  Do not take laxatives unless directed to do so by your health care provider.  Try a clear liquid diet (broth, tea, or water) as directed by your health care provider. Slowly move to a bland diet as tolerated. SEEK MEDICAL CARE IF:  You have unexplained abdominal pain.  You have abdominal pain associated with nausea or diarrhea.  You have pain when you urinate or have a bowel movement.  You experience abdominal pain that wakes you in the night.  You have abdominal pain that is worsened or improved by eating food.  You have abdominal pain that is worsened with eating fatty foods.  You have a fever. SEEK IMMEDIATE MEDICAL CARE IF:  Your pain does not go away within 2 hours.  You keep throwing up (vomiting).  Your pain is felt only in portions of the abdomen, such as the right side or the left lower portion of the abdomen.  You pass bloody or black tarry stools. MAKE SURE YOU:  Understand these instructions.  Will watch your condition.  Will get help right away if you are not doing well or get worse.   This information is not intended to replace advice given to you by your health care provider. Make sure you  discuss any questions you have with your health care provider.   Document Released: 09/10/2005 Document Revised: 08/22/2015 Document Reviewed: 08/10/2013 Else[employer] Interactive Patient Education [employer]

## 2016-10-01 IMAGING — US US OB TRANSVAGINAL
1 series · 15 of 28 positions shown · non-contrast
Comparison: 07/24/2015

CLINICAL DATA: Abdominal pain. Estimated gestational age by LMP is
5 weeks 4 days. Quantitative beta HCG is 152.

EXAM:
OBSTETRIC <14 WK US AND TRANSVAGINAL OB US
TECHNIQUE: Both transabdominal and transvaginal ultrasound examinations were
performed for complete evaluation of the gestation as well as the
maternal uterus, adnexal regions, and pelvic cul-de-sac.
Transvaginal technique was performed to assess early pregnancy.

[Series 1: us ob transvaginal · 15 of 59 slices shown]
[im 1/59]
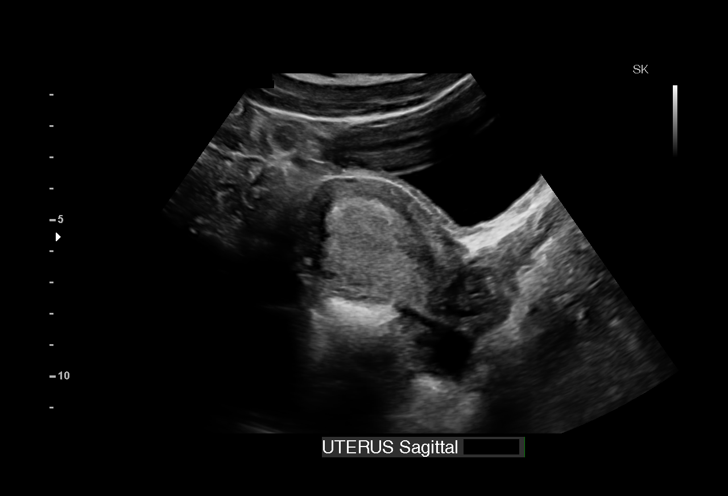
[im 5/59]
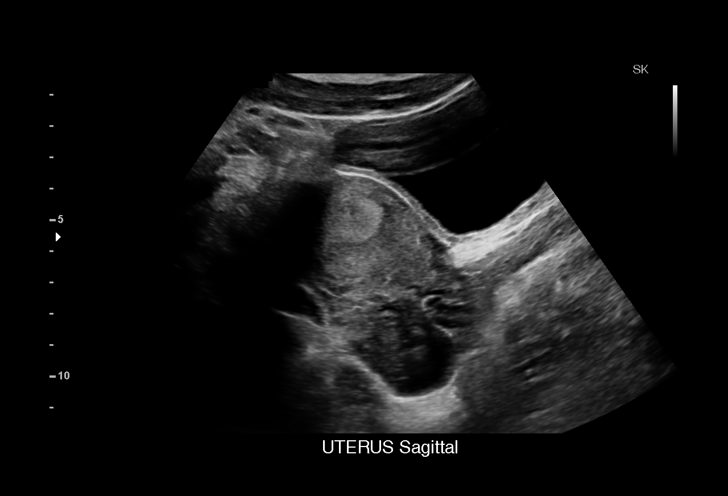
[im 9/59]
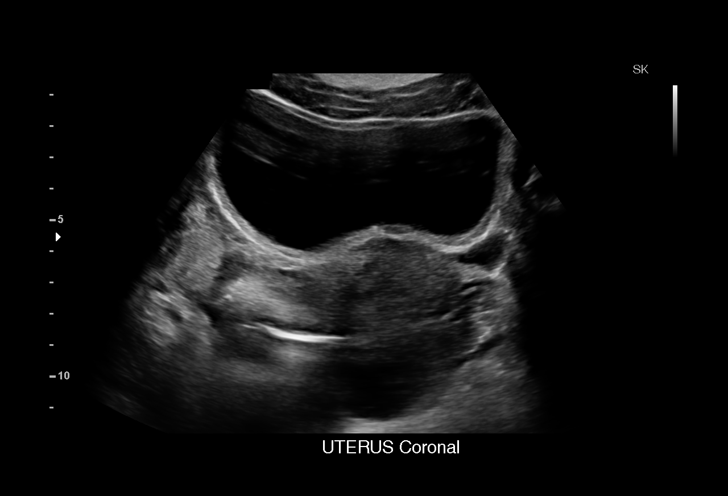
[im 13/59]
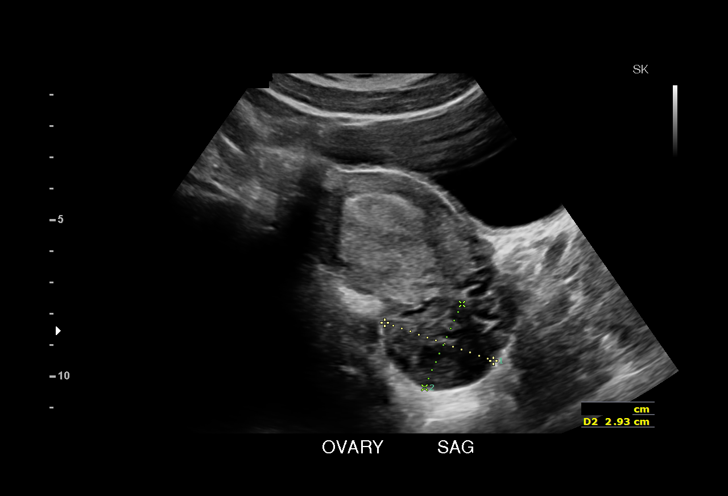
[im 18/59]
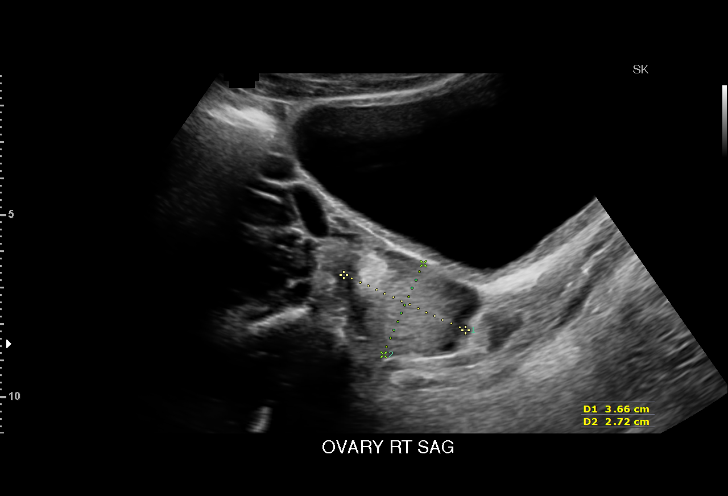
[im 22/59]
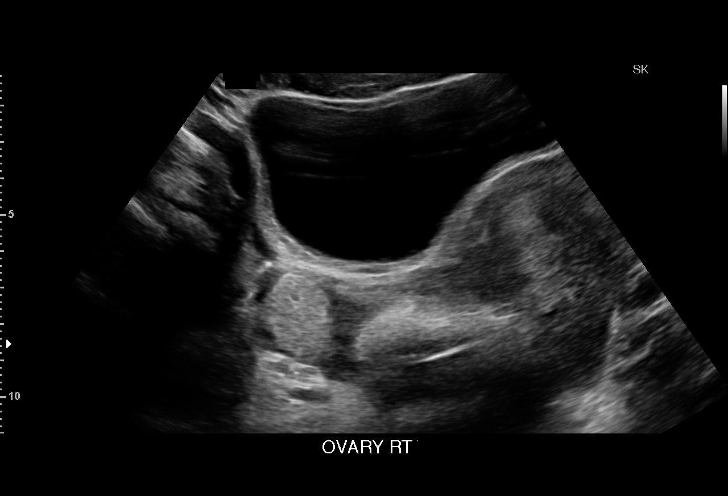
[im 26/59]
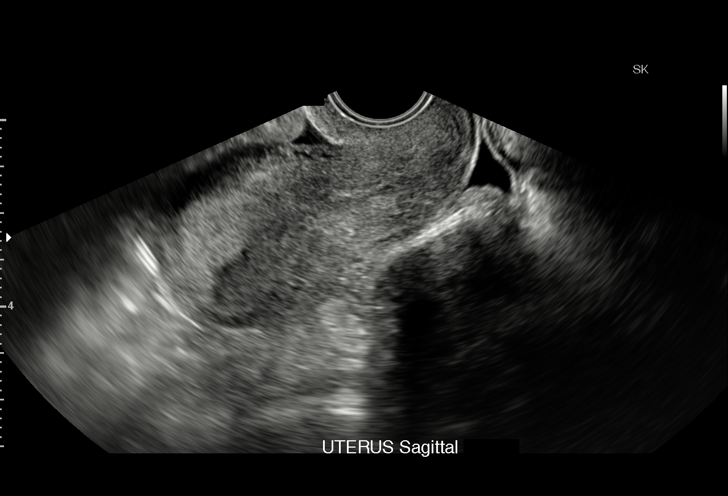
[im 31/59]
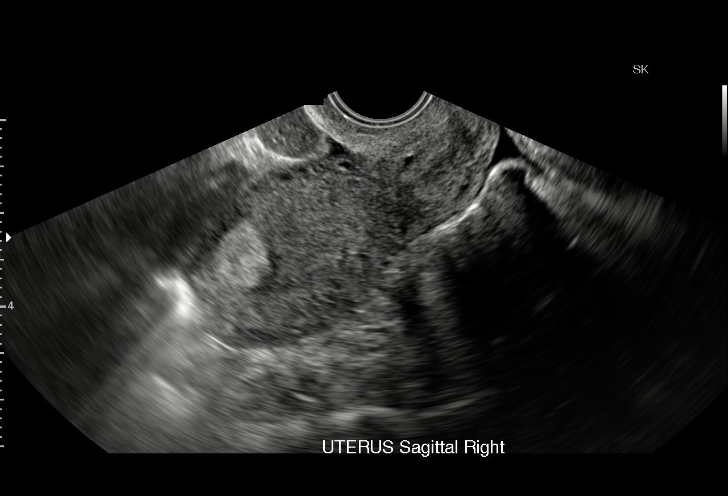
[im 33/59]
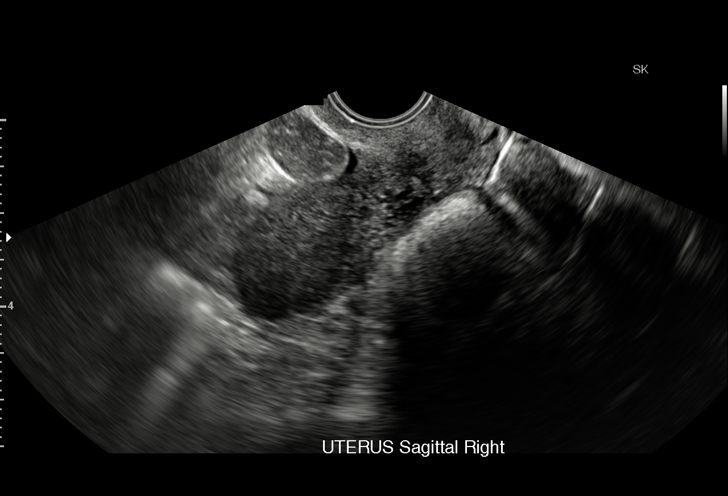
[im 37/59]
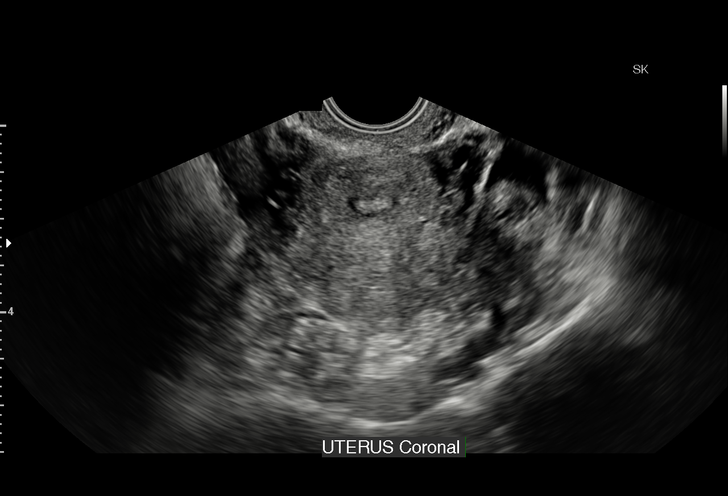
[im 41/59]
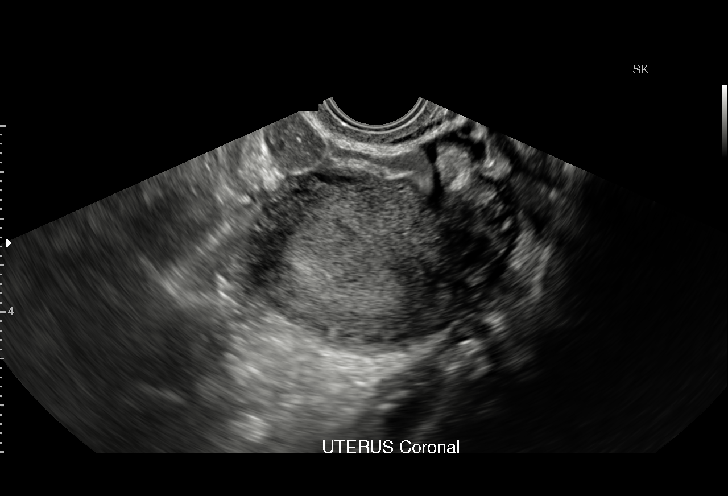
[im 46/59]
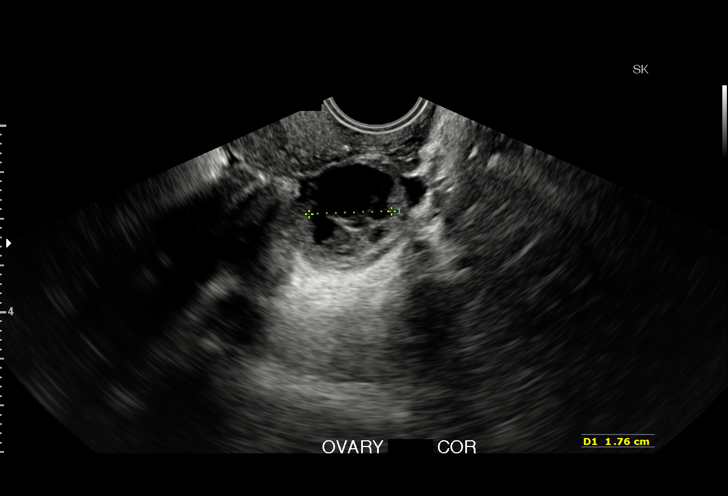
[im 50/59]
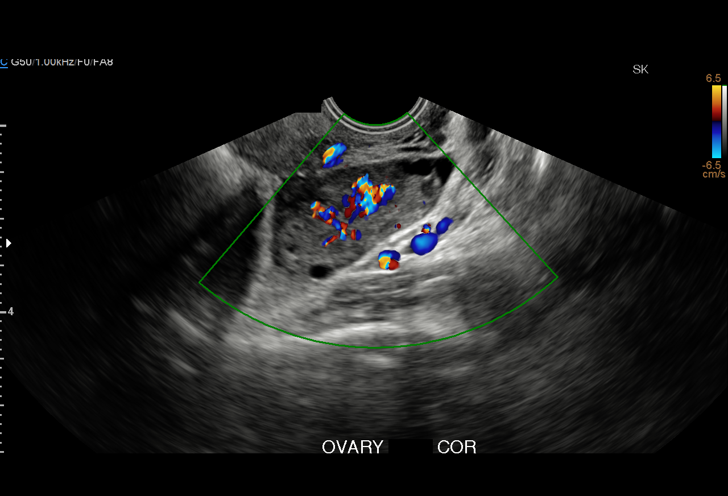
[im 54/59]
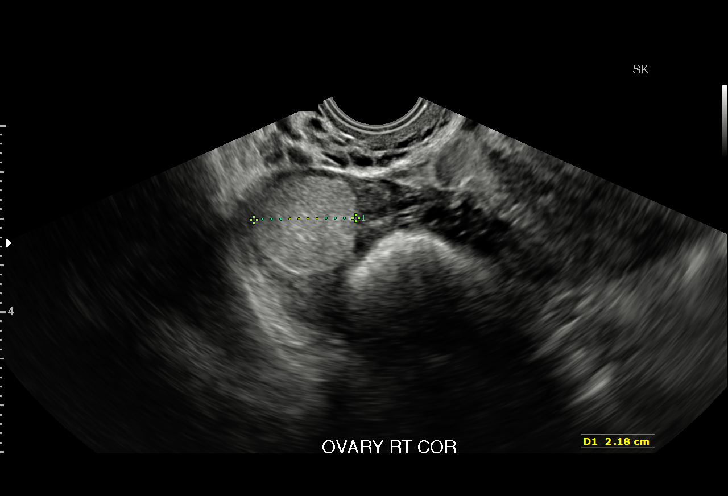
[im 59/59]
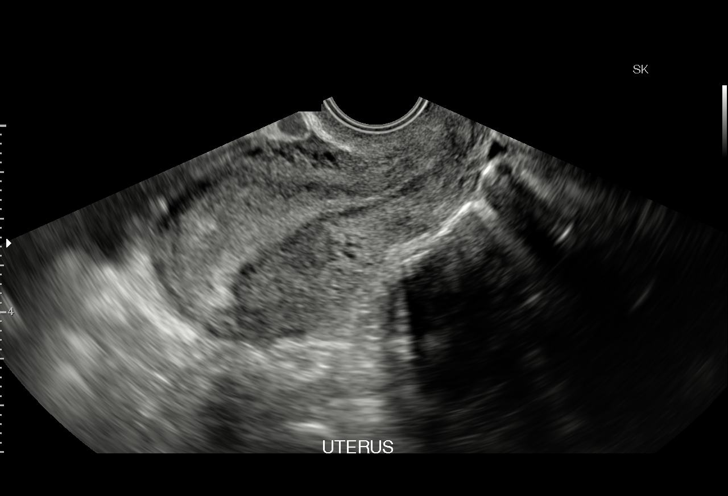

[15 of 28 positions shown; findings below may reference images not displayed]

FINDINGS: Intrauterine gestational sac: No intrauterine pregnancy
demonstrated.

Yolk sac:  Not identified.

Embryo:  Not identified.

Cardiac Activity: Not identified.

Maternal uterus/adnexae: Uterus is anteverted. No myometrial mass
lesions. Prominent hyperechoic endometrium is nonspecific.

Right ovary measures 3.5 x 2.6 by 2.8 cm. There is a homogeneous
hyperechoic structure within the right ovary measuring 2.6 cm
maximal diameter. This lesion was present on the previous study.
Appearance is suggestive of ovarian dermoid cyst.

Left ovary measures 4.5 x 2.2 x 2.9 cm. Complex cyst likely
hemorrhagic measuring 2.4 cm maximal diameter. Small amount of flow
around the cyst on color flow Doppler imaging. This may represent a
corpus luteal cyst.

Small amount of free fluid in the pelvis.
IMPRESSION: No intrauterine pregnancy identified. No intrauterine gestational
sac, yolk sac, or fetal pole identified. Differential considerations
include intrauterine pregnancy too early to be sonographically
visualized, missed abortion, or ectopic pregnancy. Followup
ultrasound is recommended in 10-14 days for further evaluation.
Homogeneous hyperechoic structure in the right ovary suggest an
ovarian dermoid cyst. Complex cyst likely hemorrhagic in the left
ovary probably representing a corpus luteal cyst.

## 2016-10-17 IMAGING — CR DG CHEST 2V
1 series · 2 of 2 positions shown · non-contrast
Comparison: None.

CLINICAL DATA: Initial evaluation for acute shortness of breath,
chest tightness.

EXAM:
CHEST  2 VIEW

[Series 1: dg chest 2 view · 0.14mm/px · 2 of 2 slices shown]
[im 1/2]
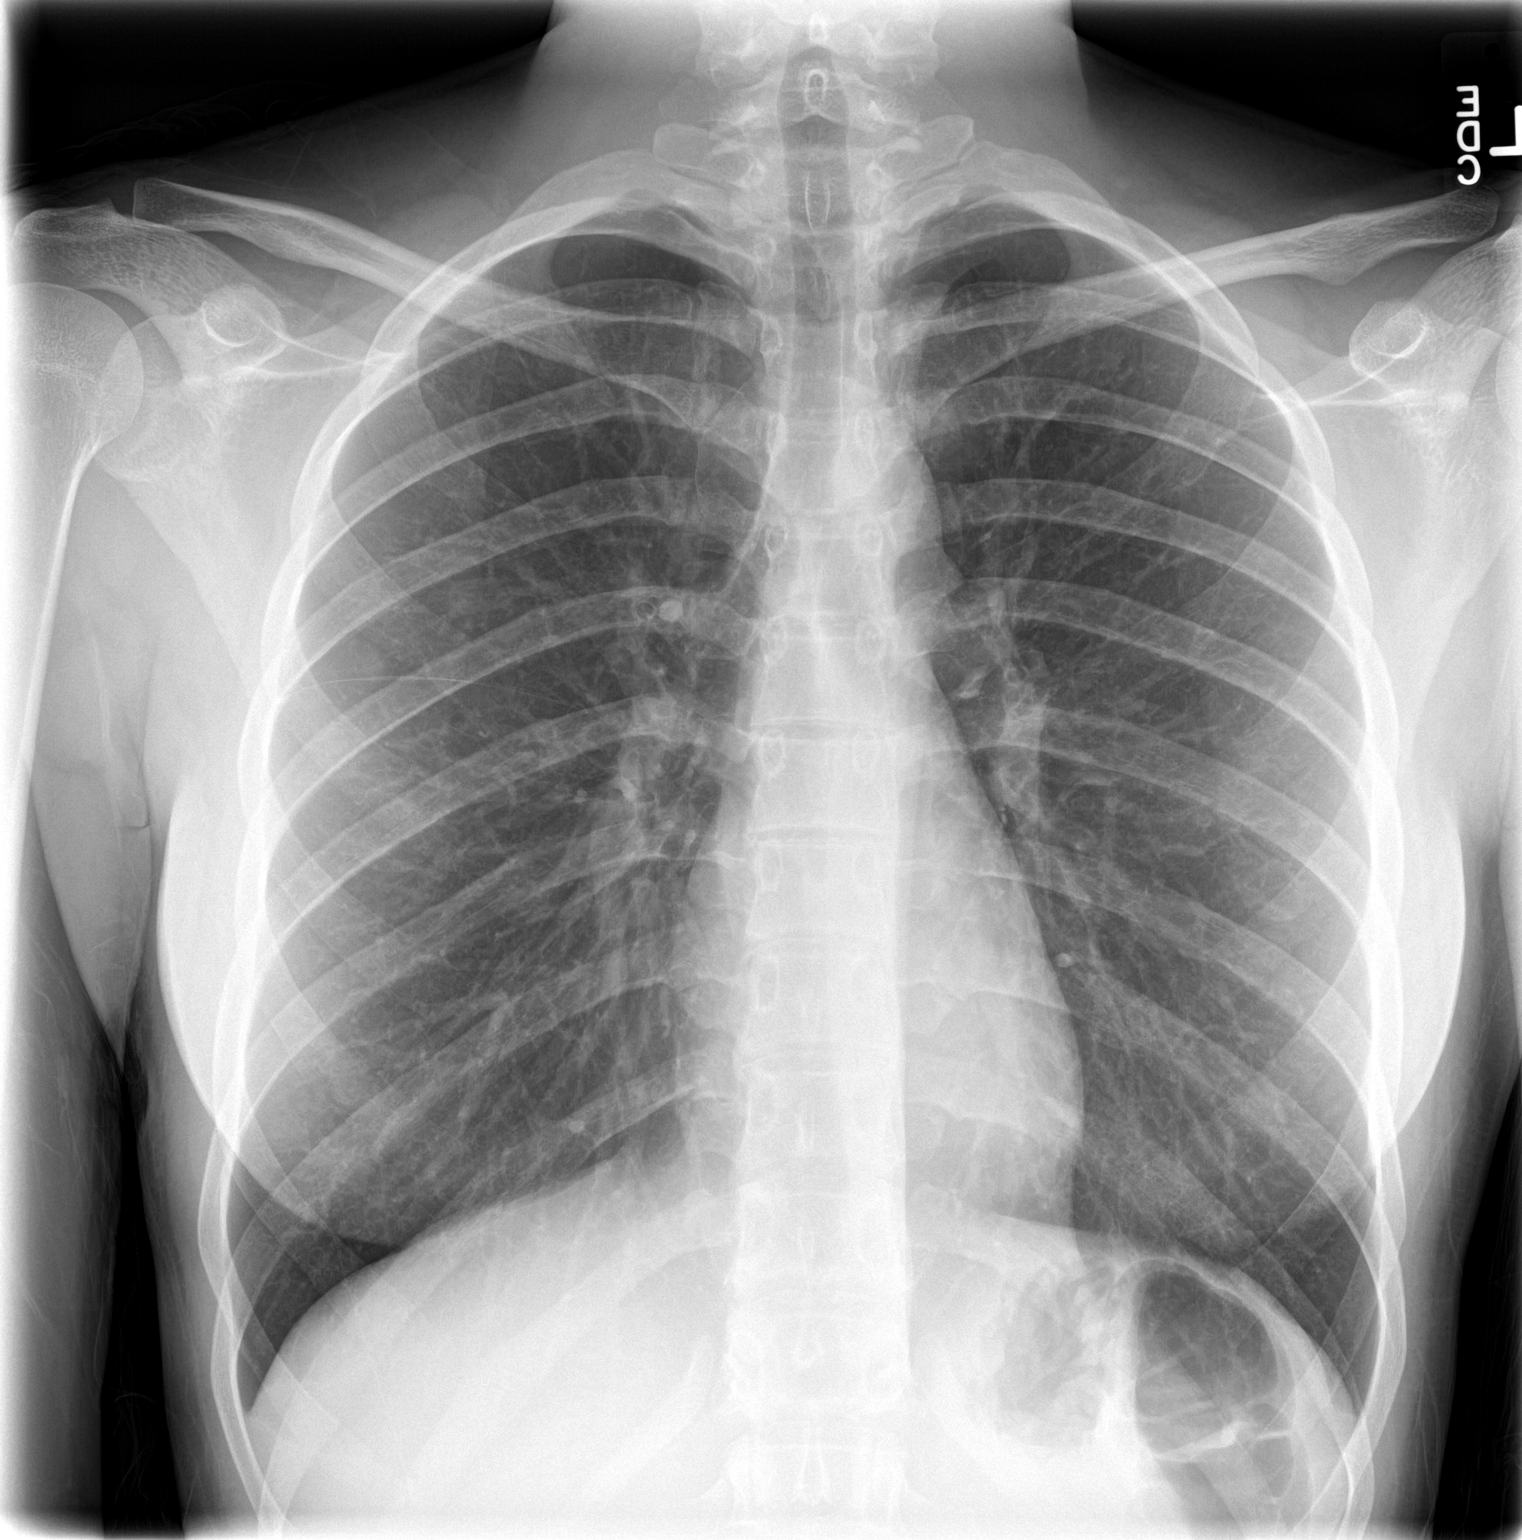
[im 2/2]
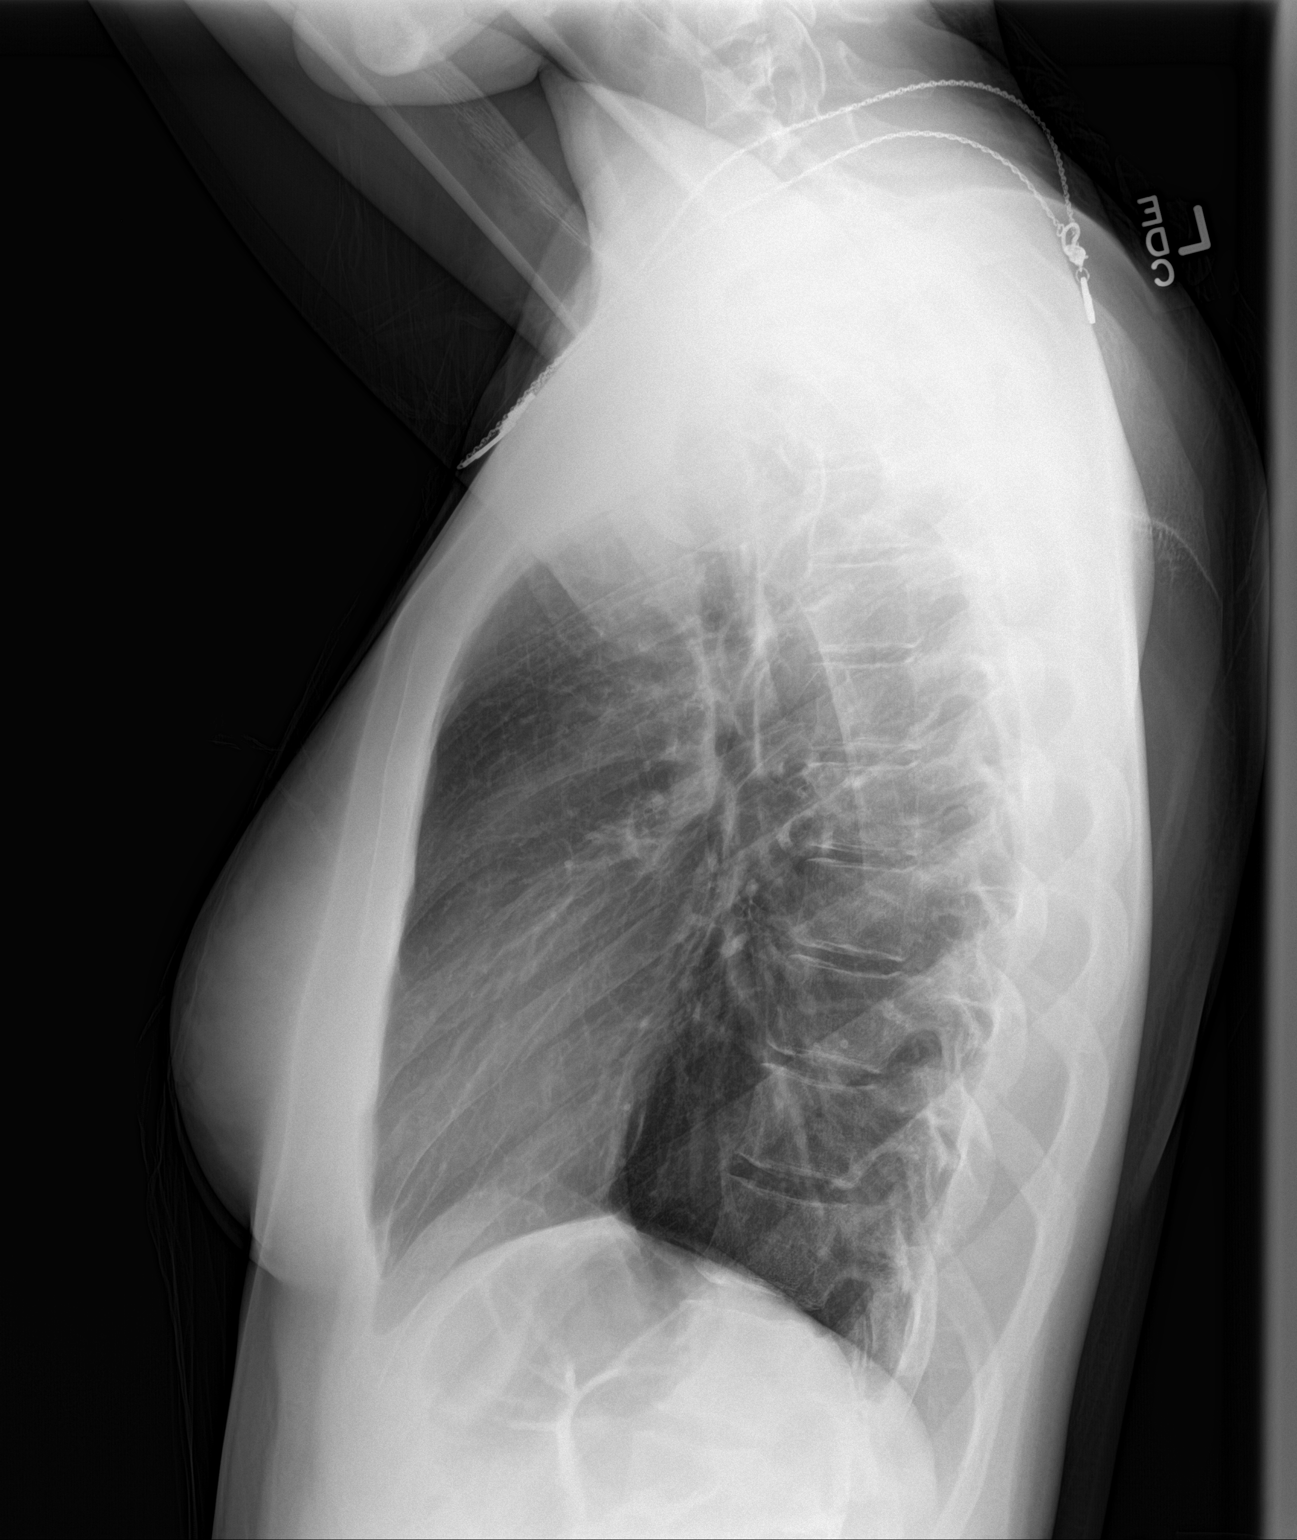

[2 of 2 positions shown; findings below may reference images not displayed]

FINDINGS: The cardiac and mediastinal silhouettes are within normal limits.

The lungs are normally inflated. No airspace consolidation, pleural
effusion, or pulmonary edema is identified. There is no
pneumothorax.

No acute osseous abnormality identified.
IMPRESSION: Normal chest radiograph with no active cardiopulmonary disease
identified.

## 2016-12-08 IMAGING — CR DG CHEST 2V
1 series · 2 of 2 positions shown · non-contrast
Comparison: 09/09/2015

CLINICAL DATA: Chest pain status post fall. Syncope prior to
arrival, now with left-sided chest pain.

EXAM:
CHEST  2 VIEW

[Series 1: dg chest 2 view · 0.14mm/px · 2 of 2 slices shown]
[im 1/2]
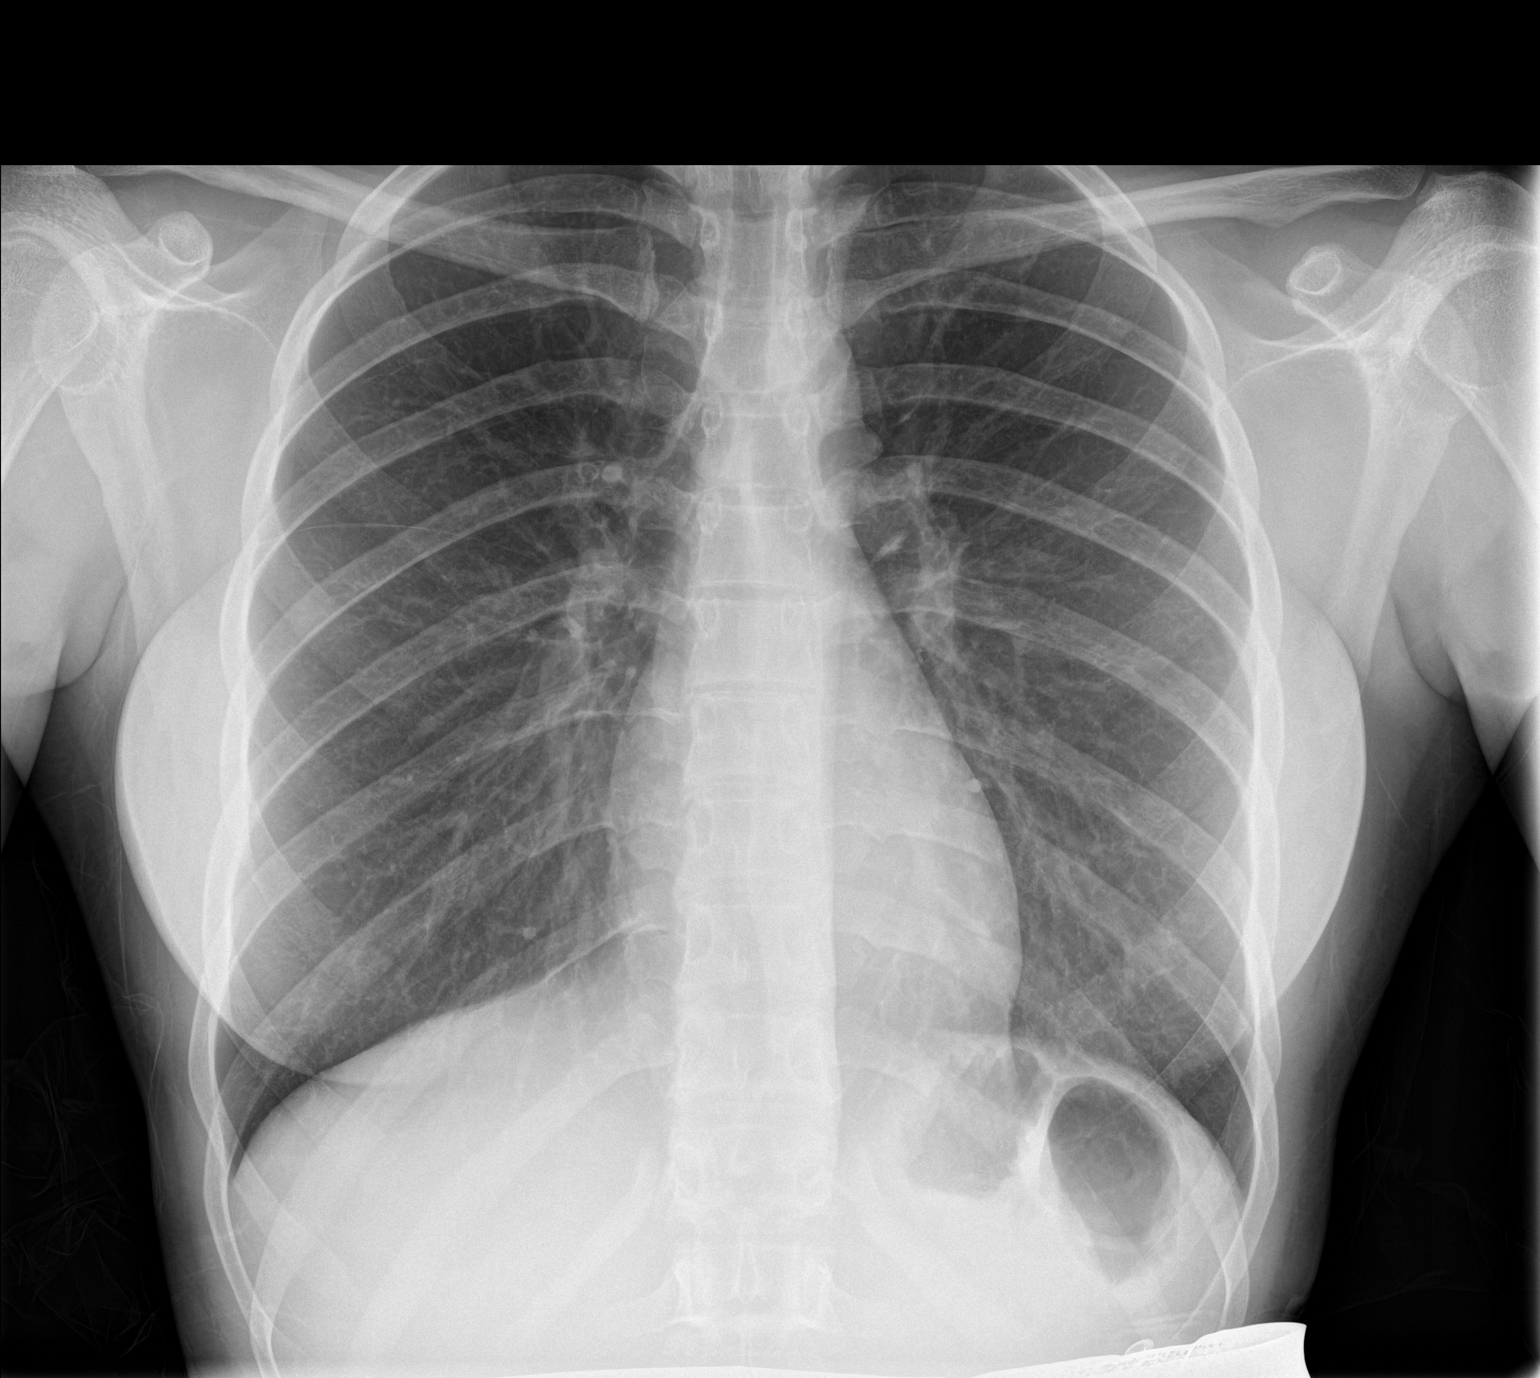
[im 2/2]
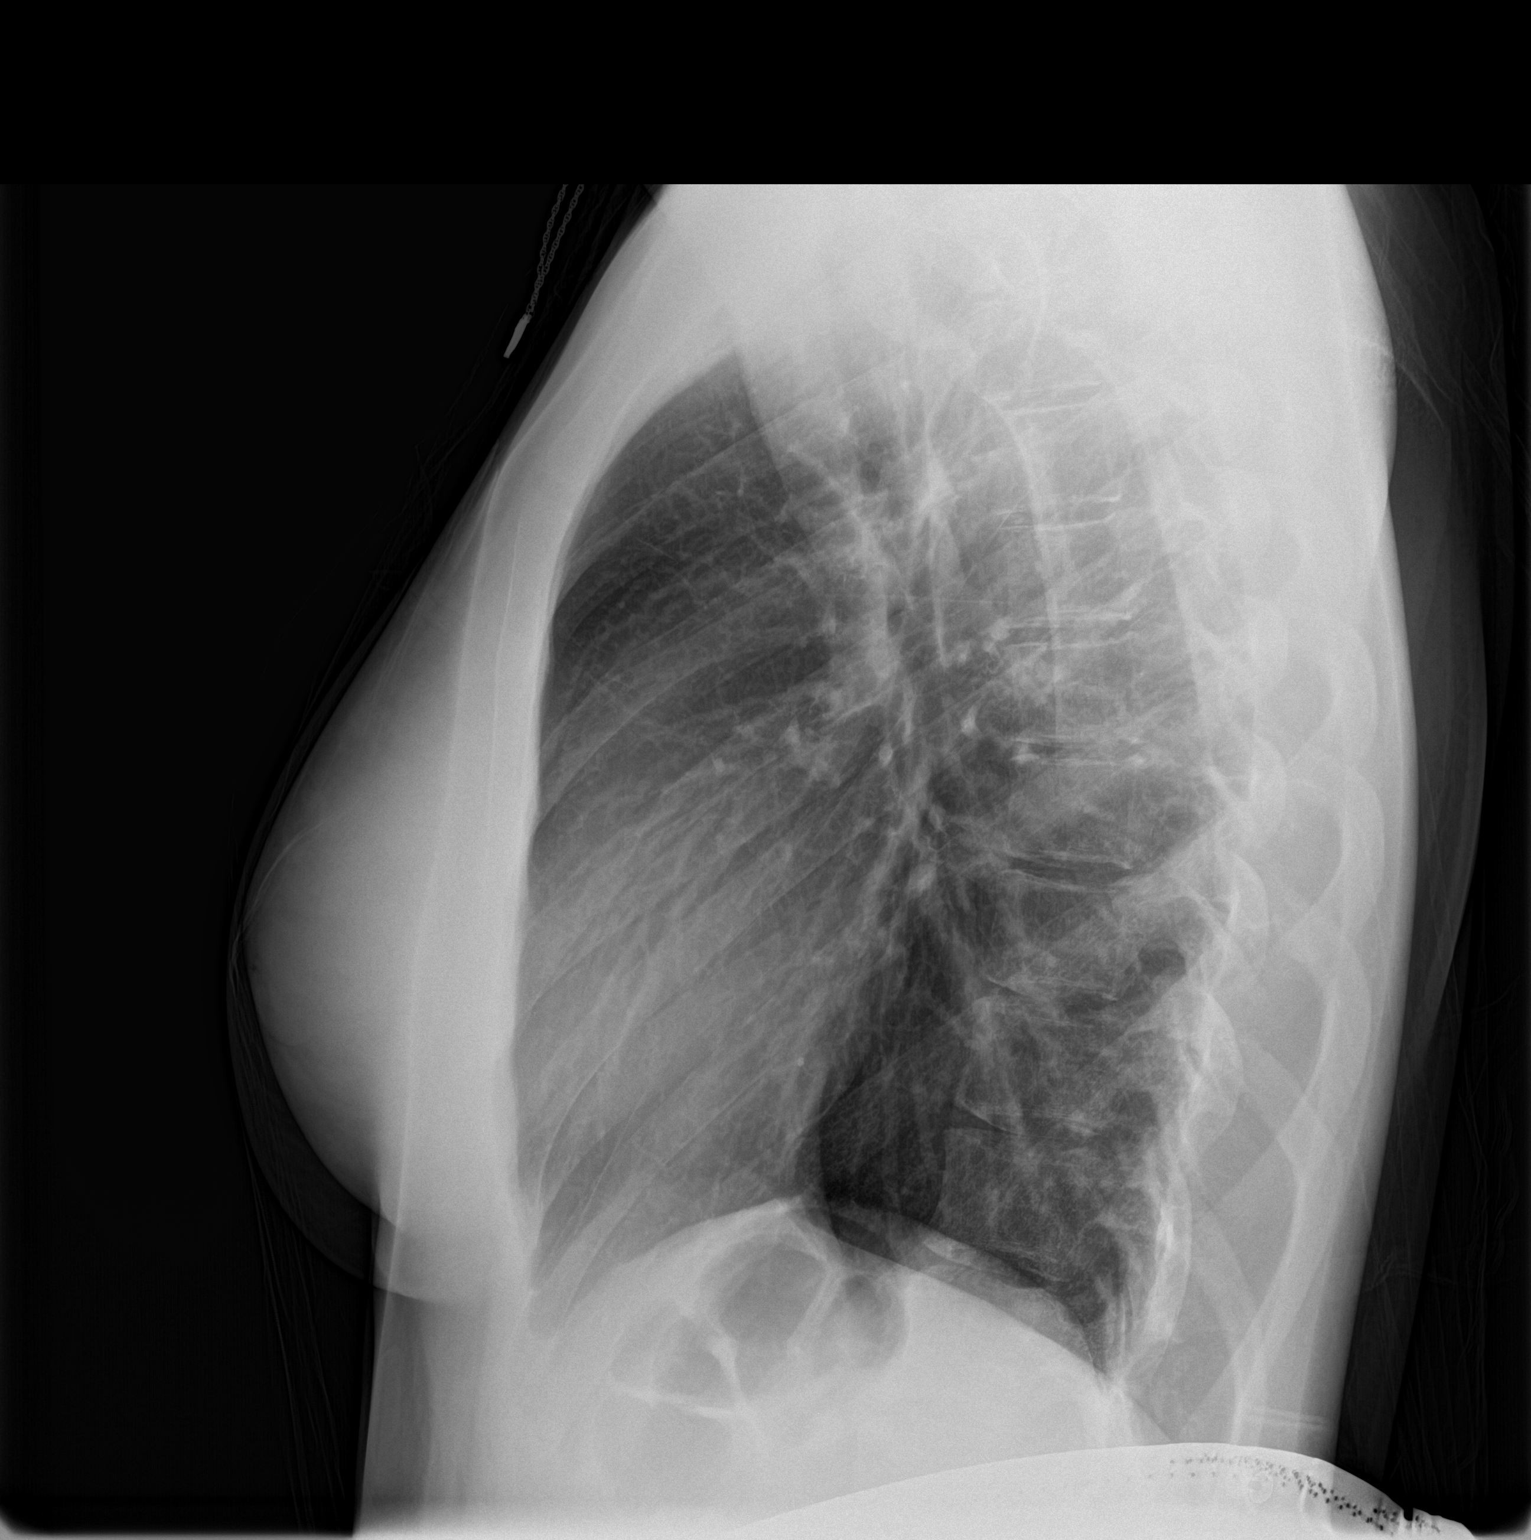

[2 of 2 positions shown; findings below may reference images not displayed]

FINDINGS: The cardiomediastinal contours are normal. The lungs are clear.
Pulmonary vasculature is normal. No consolidation, pleural effusion,
or pneumothorax. No acute osseous abnormalities are seen. No
displaced rib fracture. Sternal margins intact.
IMPRESSION: No acute process.  No displaced rib fracture.

## 2016-12-31 IMAGING — US US OB LIMITED
1 series · 14 of 28 positions shown · non-contrast
Comparison: Obstetrical ultrasound on August 27, 2015

CLINICAL DATA: Lower abdominal pain and intermittent vaginal
bleeding for 10-12 days. Gestational age by last menstrual period 13
weeks and 3 days.

EXAM:
LIMITED OBSTETRIC ULTRASOUND

[Series 1: us ob limited · 0.17mm/px · 35 acquisitions, 14 frames shown]
[im 2/35]
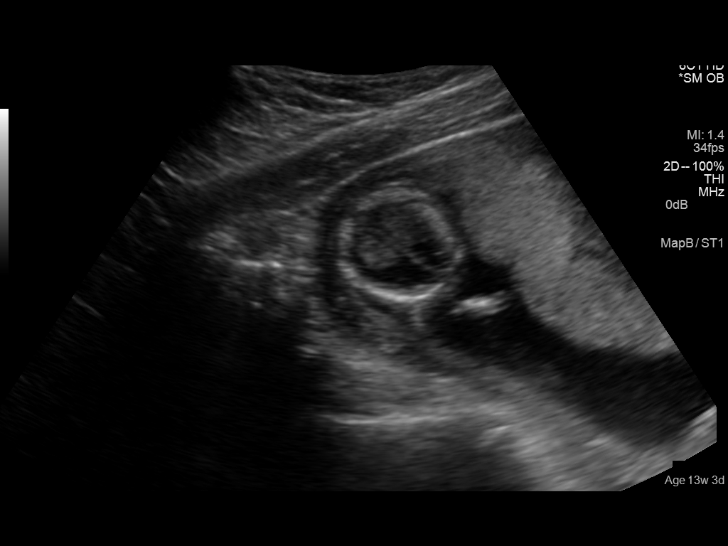
[im 4/35]
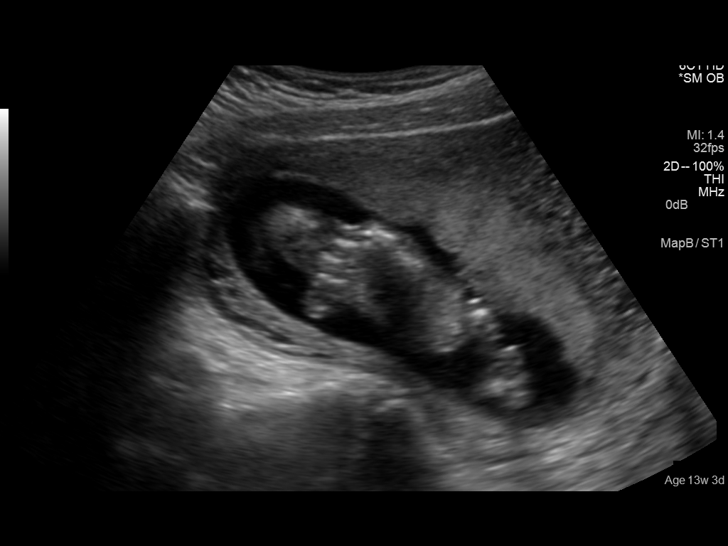
[im 7/35]
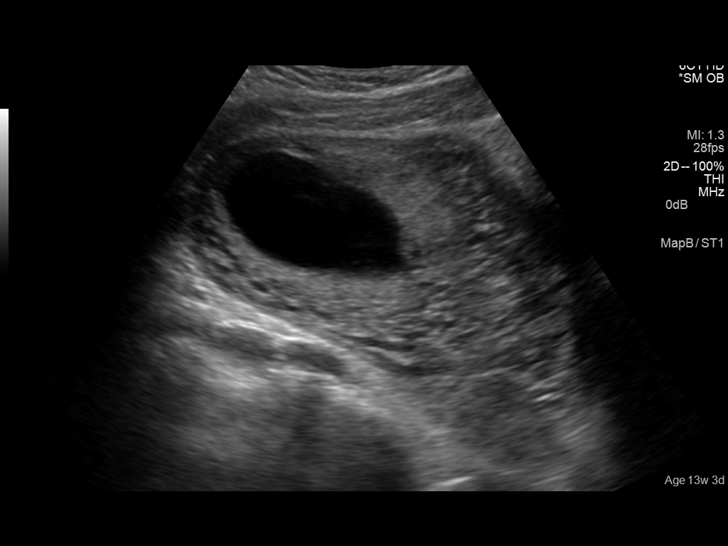
[im 9/35]
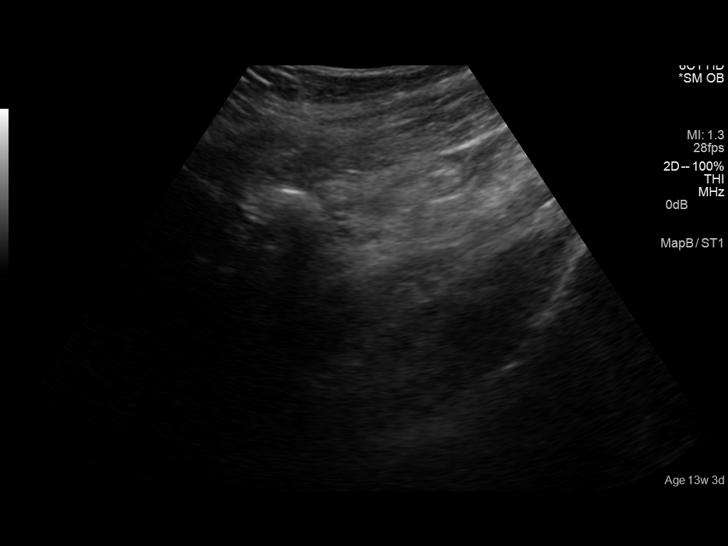
[im 12/35]
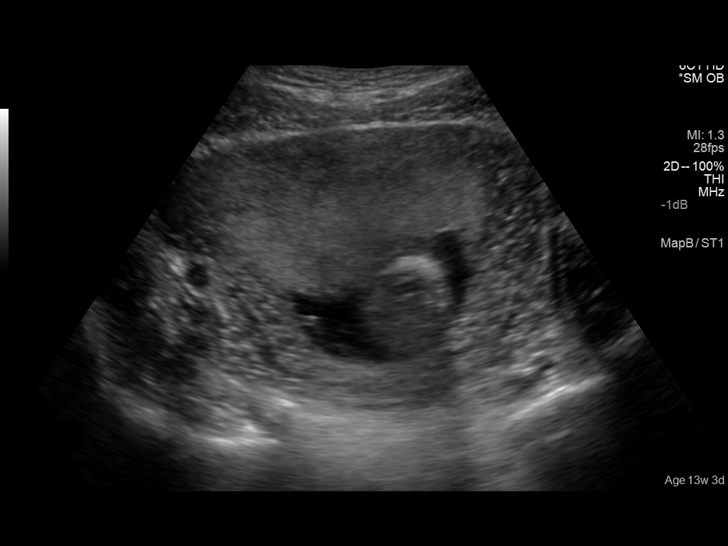
[im 14/35]
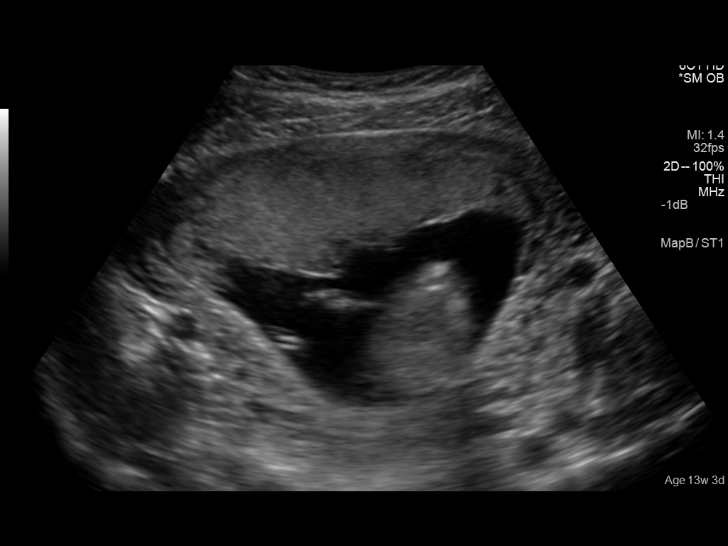
[im 17/35]
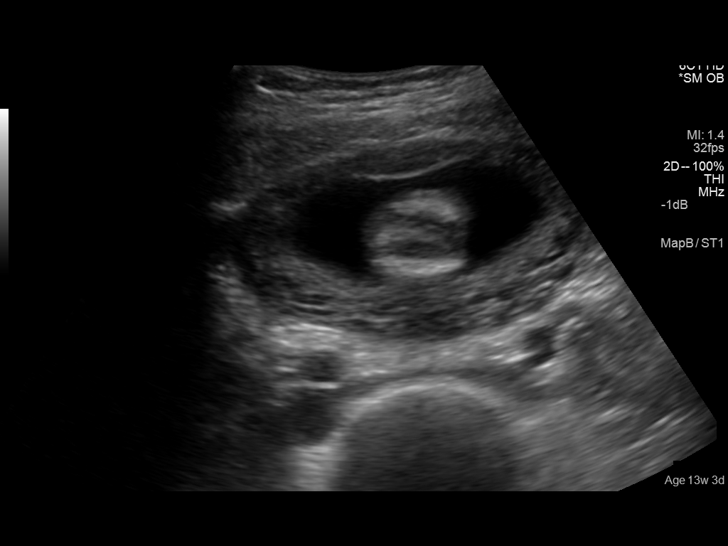
[im 19/35]
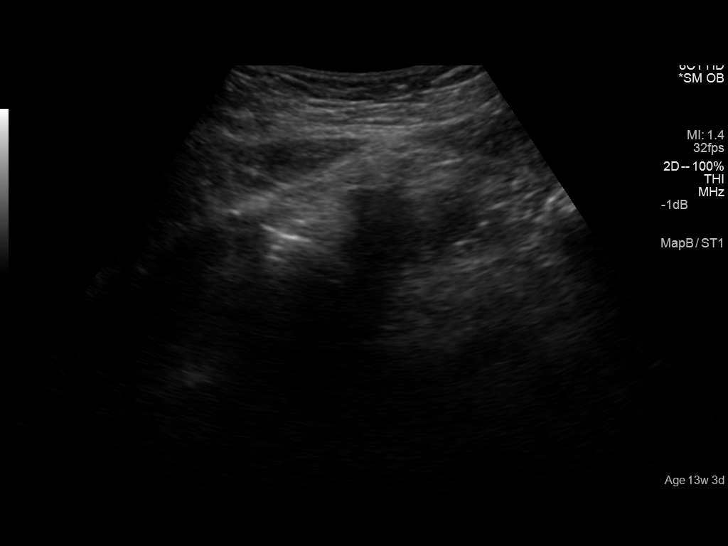
[im 22/35]
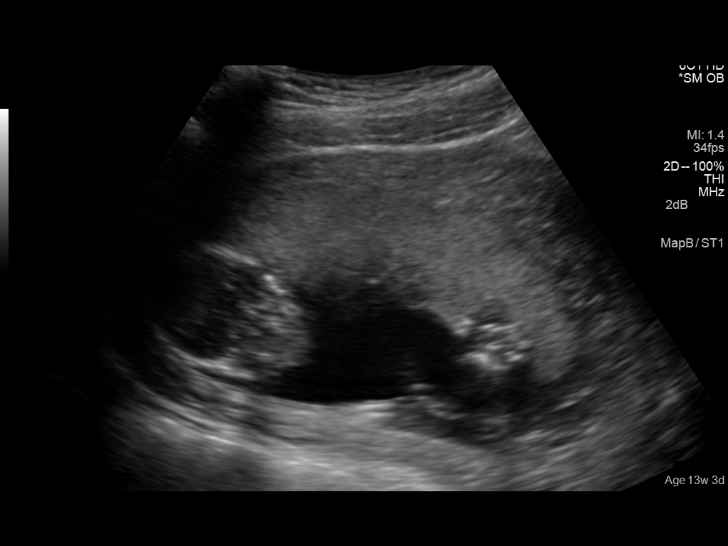
[im 24/35]
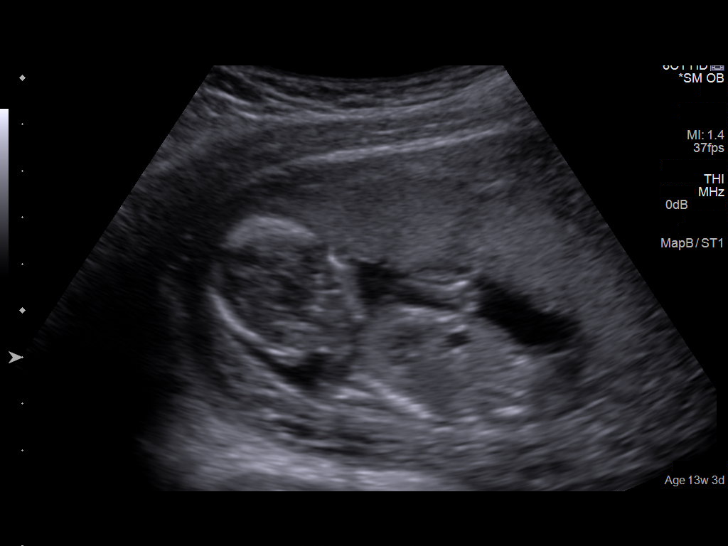
[im 27/35]
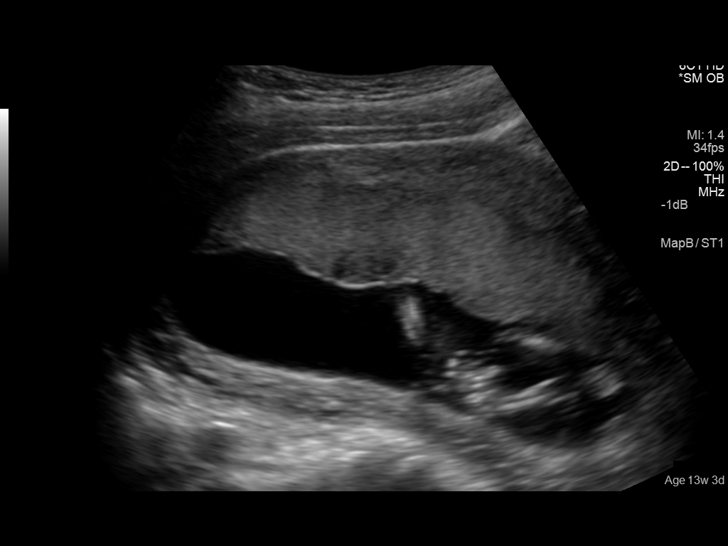
[im 29/35]
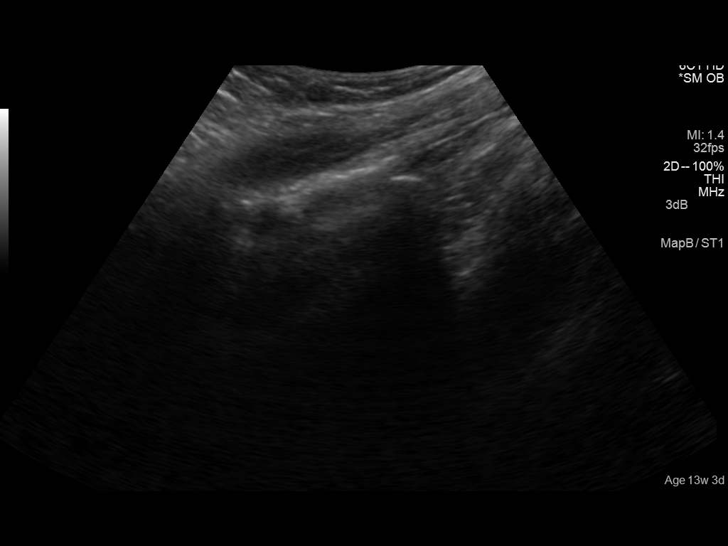
[im 32/35]
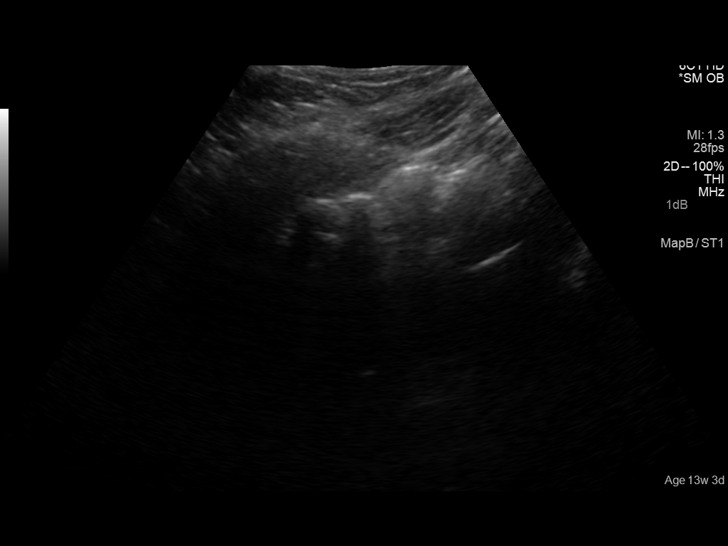
[im 35/35]
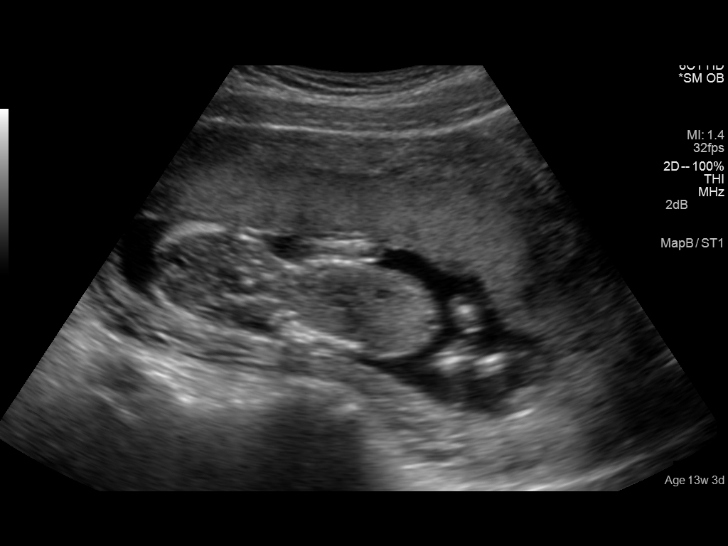

[14 of 28 positions shown; findings below may reference images not displayed]

FINDINGS: Number of Fetuses: 1

Heart Rate:  162 bpm

Movement: Yes

Presentation: Breech

Placental Location: Anterior

Previa: No

Amniotic Fluid (Subjective):  Within normal limits.

BPD:  2.7cm 14w  5d

MATERNAL FINDINGS:

Cervix:  Appears closed.

Uterus/Adnexae:  No abnormality visualized.
IMPRESSION: Single live intrauterine pregnancy without immediate complications;
gestational age by last menstrual period 14 weeks and 5 days, EDD

This exam is performed on an emergent basis and does not
comprehensively evaluate fetal size, dating, or anatomy; follow-up
complete OB US should be considered if further fetal assessment is
warranted.
